# Patient Record
Sex: Female | Born: 1960 | Race: White | Hispanic: No | Marital: Married | Smoking: Never smoker
Health system: Southern US, Community
[De-identification: ages and names within clinical notes are randomized; demographics above are authoritative.]

## PROBLEM LIST (undated history)

## (undated) DIAGNOSIS — Z8041 Family history of malignant neoplasm of ovary: Secondary | ICD-10-CM

## (undated) DIAGNOSIS — E785 Hyperlipidemia, unspecified: Secondary | ICD-10-CM

## (undated) DIAGNOSIS — Z800 Family history of malignant neoplasm of digestive organs: Secondary | ICD-10-CM

## (undated) DIAGNOSIS — K635 Polyp of colon: Secondary | ICD-10-CM

## (undated) DIAGNOSIS — I10 Essential (primary) hypertension: Secondary | ICD-10-CM

## (undated) DIAGNOSIS — E079 Disorder of thyroid, unspecified: Secondary | ICD-10-CM

## (undated) DIAGNOSIS — K760 Fatty (change of) liver, not elsewhere classified: Secondary | ICD-10-CM

## (undated) DIAGNOSIS — Z87442 Personal history of urinary calculi: Secondary | ICD-10-CM

## (undated) DIAGNOSIS — F419 Anxiety disorder, unspecified: Secondary | ICD-10-CM

## (undated) DIAGNOSIS — K219 Gastro-esophageal reflux disease without esophagitis: Secondary | ICD-10-CM

## (undated) DIAGNOSIS — R87619 Unspecified abnormal cytological findings in specimens from cervix uteri: Secondary | ICD-10-CM

## (undated) HISTORY — DX: Family history of malignant neoplasm of ovary: Z80.41

## (undated) HISTORY — DX: Fatty (change of) liver, not elsewhere classified: K76.0

## (undated) HISTORY — DX: Family history of malignant neoplasm of digestive organs: Z80.0

## (undated) HISTORY — PX: PARATHYROIDECTOMY: SHX19

## (undated) HISTORY — DX: Disorder of thyroid, unspecified: E07.9

## (undated) HISTORY — DX: Essential (primary) hypertension: I10

## (undated) HISTORY — DX: Personal history of urinary calculi: Z87.442

## (undated) HISTORY — DX: Anxiety disorder, unspecified: F41.9

## (undated) HISTORY — DX: Polyp of colon: K63.5

## (undated) HISTORY — DX: Unspecified abnormal cytological findings in specimens from cervix uteri: R87.619

## (undated) HISTORY — PX: TONSILLECTOMY: SUR1361

## (undated) HISTORY — DX: Hyperlipidemia, unspecified: E78.5

## (undated) HISTORY — DX: Gastro-esophageal reflux disease without esophagitis: K21.9

## (undated) HISTORY — PX: CHOLECYSTECTOMY: SHX55

---

## 2005-10-08 HISTORY — PX: CERVICAL BIOPSY  W/ LOOP ELECTRODE EXCISION: SUR135

## 2006-08-02 ENCOUNTER — Encounter (INDEPENDENT_AMBULATORY_CARE_PROVIDER_SITE_OTHER): Payer: Self-pay | Admitting: *Deleted

## 2006-08-02 ENCOUNTER — Ambulatory Visit (HOSPITAL_COMMUNITY): Admission: RE | Admit: 2006-08-02 | Discharge: 2006-08-02 | Payer: Self-pay | Admitting: Obstetrics and Gynecology

## 2006-10-08 HISTORY — PX: COLPOSCOPY: SHX161

## 2009-04-09 ENCOUNTER — Encounter: Admission: RE | Admit: 2009-04-09 | Discharge: 2009-04-09 | Payer: Self-pay | Admitting: Cardiology

## 2009-04-23 ENCOUNTER — Encounter: Admission: RE | Admit: 2009-04-23 | Discharge: 2009-04-23 | Payer: Self-pay | Admitting: Internal Medicine

## 2009-10-28 ENCOUNTER — Encounter: Admission: RE | Admit: 2009-10-28 | Discharge: 2009-10-28 | Payer: Self-pay | Admitting: Internal Medicine

## 2011-02-23 NOTE — Op Note (Signed)
NAME:  Erin Andrade, Erin Andrade                ACCOUNT NO.:  [id number]   MEDICAL RECORD NO.:  [id number]          PATIENT TYPE:  AMB   LOCATION:  SDC                           FACILITY:  [facility]   PHYSICIAN:  Randye Lobo, M.D.   DATE OF BIRTH:  10-Oct-1960   DATE OF PROCEDURE:  08/02/2006  DATE OF DISCHARGE:                                 OPERATIVE REPORT   PREOPERATIVE DIAGNOSIS:  Persistent and recurrent abnormal Papanicolaou  smears.   POSTOPERATIVE DIAGNOSIS:  Persistent and recurrent abnormal Papanicolaou  smears.   PROCEDURES:  A loop electrosurgical excision procedure cone.   SURGEON:  Randye Lobo, M.D.   ANESTHESIA:  MAC, local with 0.5% lidocaine with epinephrine 1:200,000.   IV FLUIDS:  1000 mL Ringer's lactate.   ESTIMATED BLOOD LOSS:  Minimal.   URINE OUTPUT:  By I&O catheter prior to procedure.   COMPLICATIONS:  None.   INDICATIONS FOR PROCEDURE:  The patient is a 50 year old gravida 73 Caucasian  female with a history of abnormal Pap smears over the last 3 years.  The  patient had her first abnormal Pap smear in January 2004, which documented  ASCUS.  The patient has had two prior colposcopies by another Kylee Umana and  has not had any treatment for her abnormal Pap smears to date.  The patient  has intermittently had normal Pap smears during that period of time.  The  patient had a Pap smear on Feb 25, 2006, documenting ASCUS, cannot rule out  high-grade squamous intraepithelial lesion.  The patient came to me for a  second opinion and I performed a colposcopy on June 07, 2006, which was  satisfactory.  The patient's ECC was negative and exocervical biopsy  documented CIN I.  After a discussion regarding options for care, a decision  was made to proceed with a LEEP procedure due to the patient's longstanding  history of abnormal Pap smears.  Risks, benefits, and alternatives were  reviewed.   FINDINGS:  Exam under anesthesia revealed a small uterus.  No adnexal  masses  were appreciated.  Exam of the cervix with acetic acid documented no gross  lesions of the cervix.   SPECIMENS:  The exocervical specimen was marked with a red pin at the 12  o'clock position and the endocervical specimen was marked with a white pin  at the 12 o'clock position.  These were sent to pathology.   PROCEDURE:  The patient was reidentified in the preoperative hold area.  She  was taken to the operating room, where a MAC anesthesia was induced.  The  patient was then placed in the dorsal lithotomy position.  The vagina and  perineum were prepped with Hibiclens.   An exam under anesthesia was performed.  An insulated speculum was placed  inside the vagina and the cervix was noted to have no gross lesions.  The  cervix was stained with acetic acid and then this was followed by Lugol's  solution to outline the transformation zone of the cervix.  The cervix was  then circumferentially injected with 0.5% lidocaine with 1:200,000 of  epinephrine.  A single pass with the loop was performed with a cutting  setting of 50 watts.  This specimen was set aside.  The endocervix was then  identified and a square-shaped loop was used to remove the endocervical  specimen, again in a single pass.  Again this specimen was set aside.  Coagulation was performed of the margins of the LEEP cone bed.  Hemostasis  was noted to be good.  Lugol's solution was placed over the surgical site  and hemostasis was noted to be excellent.  The instruments were then removed  from the vagina.  The exocervix and endocervix were marked as noted above  and were sent to pathology in formalin.   This concluded the patient's procedure.  There were no complications.  All  needle, instrument, sponge counts were correct.      Randye Lobo, M.D.  Electronically Signed     BES/MEDQ  D:  08/02/2006  T:  08/03/2006  Job:  [id number]

## 2015-03-11 ENCOUNTER — Encounter: Payer: Self-pay | Admitting: Obstetrics and Gynecology

## 2015-03-11 ENCOUNTER — Ambulatory Visit (INDEPENDENT_AMBULATORY_CARE_PROVIDER_SITE_OTHER): Payer: BLUE CROSS/BLUE SHIELD | Admitting: Obstetrics and Gynecology

## 2015-03-11 VITALS — BP 132/70 | HR 80 | Resp 14 | Ht 63.25 in | Wt 192.0 lb

## 2015-03-11 DIAGNOSIS — Z01419 Encounter for gynecological examination (general) (routine) without abnormal findings: Secondary | ICD-10-CM | POA: Diagnosis not present

## 2015-03-11 DIAGNOSIS — N926 Irregular menstruation, unspecified: Secondary | ICD-10-CM | POA: Diagnosis not present

## 2015-03-11 DIAGNOSIS — Z8041 Family history of malignant neoplasm of ovary: Secondary | ICD-10-CM

## 2015-03-11 NOTE — Progress Notes (Signed)
Patient ID: Erin Andrade, female   DOB: 1961/07/09, 54 y.o.   MRN: [id number] 54 y.o.   Married Caucasian female here for annual exam.    Hot flashes are manageable.   Labs 3 weeks ago with PCP.  All normal.    Maternal grandmother and maternal aunt with ovarian cancer.  No family history of breast cancer.  Mother had colon cancer.  No family history of uterine cancer.   PCP:  Dr. Gilford Rile  Patient's last menstrual period was 02/18/2015.    Prior LMP has 6 months ago.  Has 3 cycles in 12 months.  Sexually active: No.  The current method of family planning is none.    Exercising: No.  The patient does not participate in regular exercise at present. Smoker:  no  Health Maintenance: Pap:  2015 WNL (unsure HPV testing) Dr. Philis Pique History of abnormal Pap:  Yes 2008 Positive for HPV had Colposcopy and LEEP per patient MMG:  2015 WNL Dr. Malachi Carl office -read at [facility] Colonoscopy:  2011 scheduled for next one 04-08-15 BMD:   ? TDaP:  Not sure  Screening Labs: PCP  Hb today: PCP Urine today: PCP   reports that she has never smoked. She has never used smokeless tobacco. She reports that she does not drink alcohol or use illicit drugs.  Past Medical History  Diagnosis Date  . Hypertension   . Hyperlipidemia   . GERD (gastroesophageal reflux disease)   . Anxiety   . History of kidney stones   . Abnormal Pap smear of cervix 2008?    HPV    Past Surgical History  Procedure Laterality Date  . Colposcopy  2008  . Cervical biopsy  w/ loop electrode excision  2008  . Cholecystectomy    . Tonsillectomy    . Parathyroidectomy      Current Outpatient Prescriptions  Medication Sig Dispense Refill  . atorvastatin (LIPITOR) 20 MG tablet   5  . esomeprazole (NEXIUM) 40 MG capsule   0  . losartan (COZAAR) 50 MG tablet   5  . sertraline (ZOLOFT) 50 MG tablet   5  . torsemide (DEMADEX) 20 MG tablet   5   No current facility-administered medications for this visit.     Family History  Problem Relation Age of Onset  . Colon cancer Mother   . Leukemia Father   . Hypertension Father   . Ovarian cancer Maternal Aunt   . Ovarian cancer Maternal Grandmother     ROS:  Pertinent items are noted in HPI.  Otherwise, a comprehensive ROS was negative.  Exam:   BP 132/70 mmHg  Pulse 80  Resp 14  Ht 5' 3.25" (1.607 m)  Wt 192 lb (87.091 kg)  BMI 33.72 kg/m2  LMP 02/18/2015    General appearance: alert, cooperative and appears stated age Head: Normocephalic, without obvious abnormality, atraumatic Neck: no adenopathy, supple, symmetrical, trachea midline and thyroid normal to inspection and palpation Lungs: clear to auscultation bilaterally Breasts: normal appearance, no masses or tenderness, Inspection negative, No nipple retraction or dimpling, No nipple discharge or bleeding, No axillary or supraclavicular adenopathy Heart: regular rate and rhythm Abdomen: soft, non-tender; bowel sounds normal; no masses,  no organomegaly Extremities: extremities normal, atraumatic, no cyanosis or edema Skin: Skin color, texture, turgor normal. No rashes or lesions Lymph nodes: Cervical, supraclavicular, and axillary nodes normal. No abnormal inguinal nodes palpated Neurologic: Grossly normal  Pelvic: External genitalia:  no lesions  Urethra:  normal appearing urethra with no masses, tenderness or lesions              Bartholins and Skenes: normal                 Vagina: normal appearing vagina with normal color and discharge, no lesions              Cervix: no lesions and small amount of brown bloody mucous at os.              Pap taken: Yes.   Bimanual Exam:  Uterus:  normal size, contour, position, consistency, mobility, non-tender.   Exam limited by body habitus.              Adnexa: Unable to feel adnexa.              Rectovaginal: Yes.  .  Confirms.              Anus:  normal sphincter tone, no lesions  Chaperone was present for  exam.  Assessment:   Well woman visit with normal exam. Perimenopausal female. Irregular menses. FH of ovarian and colon cancer. Hx of LEEP. Limited pelvic exam.   Plan: Yearly mammogram recommended after age 55.  Recommended self breast exam.  Pap and HR HPV as above. Labs performed.  Yes.  .   See orders.  FSH and estradiol. Refills given on medications.  No..    Referral for genetics counseling.  Return for pelvic ultrasound.  Colonoscopy due this year. Follow up annually and prn.   After visit summary provided.

## 2015-03-11 NOTE — Patient Instructions (Signed)

## 2015-03-12 LAB — FOLLICLE STIMULATING HORMONE: FSH: 8.7 m[IU]/mL

## 2015-03-12 LAB — ESTRADIOL: ESTRADIOL: 61.7 pg/mL

## 2015-03-14 ENCOUNTER — Telehealth: Payer: Self-pay | Admitting: Obstetrics and Gynecology

## 2015-03-14 NOTE — Telephone Encounter (Signed)
Call to patient. Advised of benefit quote received for PUS. °Patient agreeable. Scheduled PUS. °Advised patient of 72 hour cancellation policy and $100 cancellation fee. Patient agreeable. °

## 2015-03-14 NOTE — Addendum Note (Signed)
Addended by: Michele Mcalpine on: 03/14/2015 02:47 PM   Modules accepted: Orders

## 2015-03-15 ENCOUNTER — Telehealth: Payer: Self-pay | Admitting: Genetic Counselor

## 2015-03-15 NOTE — Telephone Encounter (Signed)
Called pt and scheduled Gen counseling appt  Erin Andrade 03/28/15 9 am

## 2015-03-16 LAB — IPS PAP TEST WITH HPV

## 2015-03-21 ENCOUNTER — Telehealth: Payer: Self-pay | Admitting: Obstetrics and Gynecology

## 2015-03-21 NOTE — Telephone Encounter (Signed)
Call to patient. She has a questions about coverage for appointment for genetics testing. Advised that usually lab work is not drawn until after appointment and benefits coverage are checked. However, number to scheduling at [facility] given to patient to call to see if they can assist with financial questions. Patient agreeable. Routing to provider for final review. Patient agreeable to disposition. Will close encounter.

## 2015-03-21 NOTE — Telephone Encounter (Signed)
Patient has a question about her referral appointment on next Monday at the [facility].

## 2015-03-28 ENCOUNTER — Other Ambulatory Visit: Payer: BLUE CROSS/BLUE SHIELD

## 2015-03-28 ENCOUNTER — Ambulatory Visit (HOSPITAL_BASED_OUTPATIENT_CLINIC_OR_DEPARTMENT_OTHER): Payer: BLUE CROSS/BLUE SHIELD | Admitting: Genetic Counselor

## 2015-03-28 ENCOUNTER — Encounter: Payer: Self-pay | Admitting: Genetic Counselor

## 2015-03-28 ENCOUNTER — Telehealth: Payer: Self-pay | Admitting: Obstetrics and Gynecology

## 2015-03-28 DIAGNOSIS — K635 Polyp of colon: Secondary | ICD-10-CM

## 2015-03-28 DIAGNOSIS — Z8051 Family history of malignant neoplasm of kidney: Secondary | ICD-10-CM

## 2015-03-28 DIAGNOSIS — Z8041 Family history of malignant neoplasm of ovary: Secondary | ICD-10-CM | POA: Diagnosis not present

## 2015-03-28 DIAGNOSIS — Z800 Family history of malignant neoplasm of digestive organs: Secondary | ICD-10-CM | POA: Diagnosis not present

## 2015-03-28 DIAGNOSIS — Z315 Encounter for genetic counseling: Secondary | ICD-10-CM

## 2015-03-28 NOTE — Telephone Encounter (Signed)
Patient cancelled her ultrasound for Thursday because she want to wait until her bloodwork for genetic testing comes back. Would like nurse to call and reschedule appointment.

## 2015-03-28 NOTE — Progress Notes (Signed)
REFERRING PROVIDER: Raina Mina, MD [place], [place] [address]   Erin Half, MD  PRIMARY PROVIDER:  Gilford Rile, MD  PRIMARY REASON FOR VISIT:  1. Family history of colon cancer   2. Family history of ovarian cancer   3. Family history of kidney cancer   4. Colon polyp      HISTORY OF PRESENT ILLNESS:   Erin Andrade, a 54 y.o. female, was seen for a [place] cancer genetics consultation at the request of Dr. Quincy Andrade due to a family history of cancer.  Erin Andrade presents to clinic today to discuss the possibility of a hereditary predisposition to cancer, genetic testing, and to further clarify her future cancer risks, as well as potential cancer risks for family members. Erin Andrade is a 54 y.o. female with no personal history of cancer.  She started colonoscopies at age 34 due to her family history of colon cancer. One polyp was found at that time.  No others have been found.  CANCER HISTORY:   No history exists.     HORMONAL RISK FACTORS:  Menarche was at age 43.  First live birth at age N/A.  OCP use for approximately 3 years.  Ovaries intact: yes.  Hysterectomy: no.  Menopausal status: premenopausal.  HRT use: 0 years. Colonoscopy: yes; normal. Mammogram within the last year: yes. Number of breast biopsies: 0. Up to date with pelvic exams:  yes. Any excessive radiation exposure in the past:  no  Past Medical History  Diagnosis Date  . Hypertension   . Hyperlipidemia   . GERD (gastroesophageal reflux disease)   . Anxiety   . History of kidney stones   . Abnormal Pap smear of cervix 2008?    HPV  . Thyroid disease   . Colon polyp     at age 11  . Family history of ovarian cancer   . Family history of colon cancer     Past Surgical History  Procedure Laterality Date  . Colposcopy  2008  . Cervical biopsy  w/ loop electrode excision  2008  . Cholecystectomy    . Tonsillectomy    . Parathyroidectomy      History   Social History  .  Marital Status: Married    Spouse Name: N/A  . Number of Children: 0  . Years of Education: N/A   Social History Main Topics  . Smoking status: Never Smoker   . Smokeless tobacco: Never Used  . Alcohol Use: No  . Drug Use: No  . Sexual Activity:    Partners: Male   Other Topics Concern  . None   Social History Narrative     FAMILY HISTORY:  We obtained a detailed, 4-generation family history.  Significant diagnoses are listed below: Family History  Problem Relation Age of Onset  . Colon cancer Mother 3  . Leukemia Father 38  . Hypertension Father   . Ovarian cancer Maternal Aunt     dx in her late 22s  . Ovarian cancer Maternal Grandmother 24  . Kidney cancer Maternal Uncle     died in early 76s  . Heart attack Paternal Grandfather    The patient does not have children.  She has one brother who is healthy.  Her mother was diagnosed with colon cancer at age 96 and died at 69.  Her mother had one brother and one sister.  The brother was diagnosed with kidney cancer in his early 38s and her sister was diagnosed  with ovarian cancer in her 26s.  Erin Andrade maternal grandmother was diagnosed with ovarian cancer at 16 and died in her 43s.  Erin Andrade father was diagnosed with leukemia at age 90.  There is no other family history of cancer. Patient's ancestors are of Caucasian descent. There is no reported Ashkenazi Jewish ancestry. There is no known consanguinity.  GENETIC COUNSELING ASSESSMENT: Erin Andrade is a 54 y.o. female with a family history of ovarian, colon and kidney which somewhat suggestive of a Lynch syndrome and predisposition to cancer. We, therefore, discussed and recommended the following at today's visit.   DISCUSSION: We reviewed the characteristics, features and inheritance patterns of hereditary cancer syndromes. We reviewed that her family history of cancer is most suggestive of Lynch syndrome.  In some cases of colon cancer, APC or MUTYH mutations can  also be found.  However, based on her family history of ovarian cancer, BRCA genes should also be considered.  We also discussed genetic testing, including the appropriate family members to test, the process of testing, insurance coverage and turn-around-time for results. We discussed the implications of a negative, positive and/or variant of uncertain significant result. We recommended Erin Andrade pursue genetic testing for the custom panel gene panel that includes the ovarian cancer genes as well as APC and MUTYH. The custom gene panel offered by [facility] includes sequencing and rearrangement analysis for the following 23 genes:  APC, ATM, BARD1, BRCA1, BRCA2, BRIP1, CDH1, CHEK2, EPCAM, FANCC, MLH1, MSH2, MSH6, MUTYH, NBN, PALB2, PMS2, PTEN, RAD51C, RAD51D, STK11, TP53, and XRCC2.     Based on Erin Andrade's family history of cancer, she meets medical criteria for genetic testing. Despite that she meets criteria, she may still have an out of pocket cost. We discussed that if her out of pocket cost for testing is over $100, the laboratory will call and confirm whether she wants to proceed with testing.  If the out of pocket cost of testing is less than $100 she will be billed by the genetic testing laboratory.   PLAN: After considering the risks, benefits, and limitations, Erin Andrade  provided informed consent to pursue genetic testing and the blood sample was sent to [facility] for analysis of the Custom panel. Results should be available within approximately 2-3 weeks' time, at which point they will be disclosed by telephone to Erin Andrade, as will any additional recommendations warranted by these results. Erin Andrade will receive a summary of her genetic counseling visit and a copy of her results once available. This information will also be available in Epic. We encouraged Erin Andrade to remain in contact with cancer genetics annually so that we can continuously update the family history and inform  her of any changes in cancer genetics and testing that may be of benefit for her family. Erin Andrade questions were answered to her satisfaction today. Our contact information was provided should additional questions or concerns arise.  Lastly, we encouraged Erin Andrade to remain in contact with cancer genetics annually so that we can continuously update the family history and inform her of any changes in cancer genetics and testing that may be of benefit for this family.   Ms.  Andrade questions were answered to her satisfaction today. Our contact information was provided should additional questions or concerns arise. Thank you for the referral and allowing [place] to share in the care of your patient.   Karen P. Florene Glen, [place], [facility] Certified Genetic Counselor [email or web address] phone: [phone]  The patient  was seen for a total of 45 minutes in face-to-face genetic counseling.  This patient was discussed with Drs. Magrinat, Lindi Adie and/or Burr Medico who agrees with the above.    _______________________________________________________________________ For Office Staff:  Number of people involved in session: 1 Was an Intern/ student involved with case: yes

## 2015-03-29 NOTE — Telephone Encounter (Signed)
Spoke with patient. Patient would like to reschedule ultrasound to 04/14/2015. States she saw genetics counselor this week and would like to have those results for Dr.Silva when she comes in. Ultrasound rescheduled for 04/14/2015 at 9:30am with 10am consult. Patient is agreeable to date and time.  Routing to provider for final review. Patient agreeable to disposition. Will close encounter.

## 2015-03-31 ENCOUNTER — Other Ambulatory Visit: Payer: BLUE CROSS/BLUE SHIELD | Admitting: Obstetrics and Gynecology

## 2015-03-31 ENCOUNTER — Other Ambulatory Visit: Payer: BLUE CROSS/BLUE SHIELD

## 2015-04-05 ENCOUNTER — Encounter: Payer: Self-pay | Admitting: Obstetrics and Gynecology

## 2015-04-13 ENCOUNTER — Other Ambulatory Visit: Payer: Self-pay

## 2015-04-13 DIAGNOSIS — Z1231 Encounter for screening mammogram for malignant neoplasm of breast: Secondary | ICD-10-CM

## 2015-04-14 ENCOUNTER — Encounter: Payer: Self-pay | Admitting: Obstetrics and Gynecology

## 2015-04-14 ENCOUNTER — Ambulatory Visit (INDEPENDENT_AMBULATORY_CARE_PROVIDER_SITE_OTHER): Payer: BLUE CROSS/BLUE SHIELD

## 2015-04-14 ENCOUNTER — Ambulatory Visit (INDEPENDENT_AMBULATORY_CARE_PROVIDER_SITE_OTHER): Payer: BLUE CROSS/BLUE SHIELD | Admitting: Obstetrics and Gynecology

## 2015-04-14 VITALS — BP 126/72 | HR 78 | Resp 14 | Ht 63.25 in | Wt 192.0 lb

## 2015-04-14 DIAGNOSIS — Z01419 Encounter for gynecological examination (general) (routine) without abnormal findings: Secondary | ICD-10-CM

## 2015-04-14 DIAGNOSIS — N915 Oligomenorrhea, unspecified: Secondary | ICD-10-CM

## 2015-04-14 DIAGNOSIS — Z8041 Family history of malignant neoplasm of ovary: Secondary | ICD-10-CM | POA: Diagnosis not present

## 2015-04-14 NOTE — Progress Notes (Signed)
Subjective  54 y.o. G0P0000 Caucasian female here for pelvic ultrasound for   LMP was 02/18/15.  FSH and estradiol were 8.7 and 61.7 respectively.   Had her colonoscopy and it was normal.  Did genetic counseling as well and did genetic testing which is still pending.   Mammogram is scheduled.  Objective  Pelvic ultrasound images and report reviewed with patient.  Uterus - no masses. EMS - 1.65 mm. Ovaries - normal.  No masses. Free fluid - no        Assessment  FH of ovarian cancer.  Normal pelvic ultrasound. Oligomenorrhea.  Perimenopausal female.  Plan  Discussion of ovarian cancer signs and symptoms.  Reassurance regarding pelvic ultrasound anatomy.  Discussion of CA125.  I feel this is not needed today.  Patient in agreement.  Discussion of perimenopausal cycles and need for monitoring of cycles.  Will call for prolonged or frequent cycles. Await results of genetic testing and recommendations to follow.   Follow up for annual exam and prn.   __15_____ minutes face to face time of which over 50% was spent in counseling.   After visit summary to patient.

## 2015-04-19 ENCOUNTER — Telehealth: Payer: Self-pay | Admitting: Genetic Counselor

## 2015-04-19 ENCOUNTER — Ambulatory Visit: Payer: Self-pay | Admitting: Genetic Counselor

## 2015-04-19 ENCOUNTER — Encounter: Payer: Self-pay | Admitting: Genetic Counselor

## 2015-04-19 DIAGNOSIS — Z1379 Encounter for other screening for genetic and chromosomal anomalies: Secondary | ICD-10-CM

## 2015-04-19 DIAGNOSIS — K635 Polyp of colon: Secondary | ICD-10-CM

## 2015-04-19 DIAGNOSIS — Z800 Family history of malignant neoplasm of digestive organs: Secondary | ICD-10-CM

## 2015-04-19 DIAGNOSIS — Z8041 Family history of malignant neoplasm of ovary: Secondary | ICD-10-CM

## 2015-04-19 NOTE — Telephone Encounter (Signed)
Patient called back.  Revealed negative genetic testing on a custom panel through [facility].  Suggested that her maternal aunt's children consider genetic testing to determine if there is a mutation in a gene that Erin Andrade did not inherit.

## 2015-04-19 NOTE — Progress Notes (Signed)
HPI: Erin Andrade was previously seen in the [facility] due to a personal history of an adenomatous polyp and a family history of colon and ovarian cancer and concerns regarding a hereditary predisposition to cancer. Additionally, Erin Andrade had been diagnosed with hyperparathyroidism which she had her parathyroid out.  Please refer to our prior cancer genetics clinic note for more information regarding Erin Andrade's medical, social and family histories, and our assessment and recommendations, at the time. Erin Andrade recent genetic test results were disclosed to her, as were recommendations warranted by these results. These results and recommendations are discussed in more detail below.  GENETIC TEST RESULTS: At the time of Erin Andrade's visit, we recommended she pursue genetic testing of a custom gene panel. This Custom gene panel offered by [facility] includes sequencing and rearrangement analysis for the following 24 genes:  APC, ATM, BARD1, BRCA1, BRCA2, BRIP1, CDH1, CHEK2, EPCAM, FANCC, MEN1, MLH1, MSH2, MSH6, MUTYH, NBN, PALB2, PMS2, PTEN, RAD51C, RAD51D, STK11, TP53, and XRCC2.  The report date is April 19, 2015.  Genetic testing was normal, and did not reveal a deleterious mutation in these genes. The test report has been scanned into EPIC and is located under the Media tab.   We discussed with Erin Andrade that since the current genetic testing is not perfect, it is possible there may be a gene mutation in one of these genes that current testing cannot detect, but that chance is small. We also discussed, that it is possible that another gene that has not yet been discovered, or that we have not yet tested, is responsible for the cancer diagnoses in the family, and it is, therefore, important to remain in touch with cancer genetics in the future so that we can continue to offer Erin Andrade the most up to date genetic testing.   CANCER SCREENING RECOMMENDATIONS: This normal  result is reassuring and indicates that Erin Andrade does not likely have an increased risk of cancer due to a mutation in one of these genes.  We, therefore, recommended  Erin Andrade continue to follow the cancer screening guidelines provided by her primary healthcare providers.   RECOMMENDATIONS FOR FAMILY MEMBERS: Women in this family might be at some increased risk of developing cancer, over the general population risk, simply due to the family history of cancer. We recommended women in this family have a yearly mammogram beginning at age 25, or 66 years younger than the earliest onset of cancer, an an annual clinical breast exam, and perform monthly breast self-exams. Women in this family should also have a gynecological exam as recommended by their primary provider. All family members should have a colonoscopy by age 75.  Based on Erin Andrade's family history, we recommended her maternal cousin, who's mother was diagnosed with ovarian cancer, have genetic counseling and testing. Erin Andrade will let us know if we can be of any assistance in coordinating genetic counseling and/or testing for this family member.   FOLLOW-UP: Lastly, we discussed with Erin Andrade that cancer genetics is a rapidly advancing field and it is possible that new genetic tests will be appropriate for her and/or her family members in the future. We encouraged her to remain in contact with cancer genetics on an annual basis so we can update her personal and family histories and let her know of advances in cancer genetics that may benefit this family.   Our contact number was provided. Erin Andrade questions were answered to her satisfaction, and she  knows she is welcome to call us at anytime with additional questions or concerns.   Roma Kayser, MS, [facility] Certified Genetic Counselor [email or web address]

## 2015-04-19 NOTE — Telephone Encounter (Signed)
LM on VM with good news.  Asked that she call back. 

## 2015-04-25 ENCOUNTER — Ambulatory Visit: Payer: BLUE CROSS/BLUE SHIELD

## 2015-04-29 ENCOUNTER — Ambulatory Visit
Admission: RE | Admit: 2015-04-29 | Discharge: 2015-04-29 | Disposition: A | Discharge status: Home / Self Care | Payer: BLUE CROSS/BLUE SHIELD | Source: Ambulatory Visit

## 2015-04-29 DIAGNOSIS — Z1231 Encounter for screening mammogram for malignant neoplasm of breast: Secondary | ICD-10-CM

## 2016-02-17 DIAGNOSIS — R5381 Other malaise: Secondary | ICD-10-CM | POA: Insufficient documentation

## 2016-02-17 DIAGNOSIS — I10 Essential (primary) hypertension: Secondary | ICD-10-CM

## 2016-02-17 DIAGNOSIS — E039 Hypothyroidism, unspecified: Secondary | ICD-10-CM

## 2016-02-17 DIAGNOSIS — E782 Mixed hyperlipidemia: Secondary | ICD-10-CM

## 2016-02-17 DIAGNOSIS — D3500 Benign neoplasm of unspecified adrenal gland: Secondary | ICD-10-CM | POA: Insufficient documentation

## 2016-02-17 DIAGNOSIS — R600 Localized edema: Secondary | ICD-10-CM

## 2016-02-17 DIAGNOSIS — R5383 Other fatigue: Secondary | ICD-10-CM

## 2016-02-17 DIAGNOSIS — F5101 Primary insomnia: Secondary | ICD-10-CM

## 2016-02-17 DIAGNOSIS — F339 Major depressive disorder, recurrent, unspecified: Secondary | ICD-10-CM

## 2016-02-17 DIAGNOSIS — E876 Hypokalemia: Secondary | ICD-10-CM | POA: Insufficient documentation

## 2016-02-17 DIAGNOSIS — E559 Vitamin D deficiency, unspecified: Secondary | ICD-10-CM | POA: Insufficient documentation

## 2016-02-17 DIAGNOSIS — Z79899 Other long term (current) drug therapy: Secondary | ICD-10-CM

## 2016-02-17 HISTORY — DX: Hypothyroidism, unspecified: E03.9

## 2016-02-17 HISTORY — DX: Other fatigue: R53.83

## 2016-02-17 HISTORY — DX: Other long term (current) drug therapy: Z79.899

## 2016-02-17 HISTORY — DX: Essential (primary) hypertension: I10

## 2016-02-17 HISTORY — DX: Other fatigue: R53.81

## 2016-02-17 HISTORY — DX: Hypokalemia: E87.6

## 2016-02-17 HISTORY — DX: Benign neoplasm of unspecified adrenal gland: D35.00

## 2016-02-17 HISTORY — DX: Vitamin D deficiency, unspecified: E55.9

## 2016-02-17 HISTORY — DX: Localized edema: R60.0

## 2016-02-17 HISTORY — DX: Mixed hyperlipidemia: E78.2

## 2016-02-17 HISTORY — DX: Primary insomnia: F51.01

## 2016-02-17 HISTORY — DX: Major depressive disorder, recurrent, unspecified: F33.9

## 2016-03-16 ENCOUNTER — Ambulatory Visit: Payer: BLUE CROSS/BLUE SHIELD | Admitting: Obstetrics and Gynecology

## 2016-04-13 ENCOUNTER — Ambulatory Visit: Payer: BLUE CROSS/BLUE SHIELD | Admitting: Obstetrics and Gynecology

## 2016-05-04 DIAGNOSIS — M2010 Hallux valgus (acquired), unspecified foot: Secondary | ICD-10-CM

## 2016-05-04 HISTORY — DX: Hallux valgus (acquired), unspecified foot: M20.10

## 2016-05-07 DIAGNOSIS — Q2381 Bicuspid aortic valve: Secondary | ICD-10-CM

## 2016-05-07 DIAGNOSIS — N200 Calculus of kidney: Secondary | ICD-10-CM | POA: Insufficient documentation

## 2016-05-07 DIAGNOSIS — Q231 Congenital insufficiency of aortic valve: Secondary | ICD-10-CM

## 2016-05-07 DIAGNOSIS — I359 Nonrheumatic aortic valve disorder, unspecified: Secondary | ICD-10-CM

## 2016-05-07 HISTORY — DX: Calculus of kidney: N20.0

## 2016-05-07 HISTORY — DX: Congenital insufficiency of aortic valve: Q23.1

## 2016-05-07 HISTORY — DX: Bicuspid aortic valve: Q23.81

## 2016-05-07 HISTORY — DX: Nonrheumatic aortic valve disorder, unspecified: I35.9

## 2016-05-15 DIAGNOSIS — M1A09X0 Idiopathic chronic gout, multiple sites, without tophus (tophi): Secondary | ICD-10-CM

## 2016-05-15 HISTORY — DX: Idiopathic chronic gout, multiple sites, without tophus (tophi): M1A.09X0

## 2016-05-24 NOTE — Progress Notes (Deleted)
55 y.o. G9P0000 Married Caucasian female here for annual exam.    PCP:     No LMP recorded.           Sexually active: {yes no:314532}  The current method of family planning is {contraception:315051}.    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 MMG:  05-02-15 B/Neg/1:TBC  Colonoscopy:  ?? 04-18-15 BMD:   ***  Result  *** TDaP:  *** Gardasil:   {YES NO:22349} HIV: Hep C: Screening Labs:  Hb today: ***, Urine today: ***   reports that she has never smoked. She has never used smokeless tobacco. She reports that she does not drink alcohol or use drugs.  Past Medical History:  Diagnosis Date  . Abnormal Pap smear of cervix 2008?   HPV  . Anxiety   . Colon polyp    at age 65  . Family history of colon cancer   . Family history of ovarian cancer   . GERD (gastroesophageal reflux disease)   . History of kidney stones   . Hyperlipidemia   . Hypertension   . Thyroid disease     Past Surgical History:  Procedure Laterality Date  . 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 CHOLECYSTECTOMY    . COLPOSCOPY  2008  . PARATHYROIDECTOMY    . TONSILLECTOMY      Current Outpatient Prescriptions  Medication Sig Dispense Refill  . atorvastatin (LIPITOR) 20 MG tablet   5  . esomeprazole (NEXIUM) 40 MG capsule   0  . losartan (COZAAR) 50 MG tablet   5  . sertraline (ZOLOFT) 50 MG tablet   5  . torsemide (DEMADEX) 20 MG tablet   5   No current facility-administered medications for this visit.     Family History  Problem Relation Age of Onset  . Colon cancer Mother 67  . Leukemia Father 87  . Hypertension Father   . Ovarian cancer Maternal Aunt     dx in her late 17s  . Ovarian cancer Maternal Grandmother 110  . Kidney cancer Maternal Uncle     died in early 88s  . Heart attack  Paternal Grandfather     ROS:  Pertinent items are noted in HPI.  Otherwise, a comprehensive ROS was negative.  Exam:   There were no vitals taken for this visit.    General appearance: alert, cooperative and appears stated age Head: Normocephalic, without obvious abnormality, atraumatic Neck: no adenopathy, supple, symmetrical, trachea midline and thyroid normal to inspection and palpation Lungs: clear to auscultation bilaterally Breasts: normal appearance, no masses or tenderness, No nipple retraction or dimpling, No nipple discharge or bleeding, No axillary or supraclavicular adenopathy Heart: regular rate and rhythm Abdomen: soft, non-tender; no masses, no organomegaly Extremities: extremities normal, atraumatic, no cyanosis or edema Skin: Skin color, texture, turgor normal. No rashes or lesions Lymph nodes: Cervical, supraclavicular, and axillary nodes normal. No abnormal inguinal nodes palpated Neurologic: Grossly normal  Pelvic: External genitalia:  no lesions              Urethra:  normal appearing urethra with no masses, tenderness or lesions              Bartholins and Skenes: normal  Vagina: normal appearing vagina with normal color and discharge, no lesions              Cervix: no lesions              Pap taken: {yes no:314532} Bimanual Exam:  Uterus:  normal size, contour, position, consistency, mobility, non-tender              Adnexa: no mass, fullness, tenderness              Rectal exam: {yes no:314532}.  Confirms.              Anus:  normal sphincter tone, no lesions  Chaperone was present for exam.  Assessment:   Well woman visit with normal exam.   Plan: Yearly mammogram recommended after age 64.  Recommended self breast exam.  Pap and HR HPV as above. Discussed Calcium, Vitamin D, regular exercise program including cardiovascular and weight bearing exercise.   Follow up annually and prn.   Additional counseling given.  {yes  [id number]. _______ minutes face to face time of which over 50% was spent in counseling.    After visit summary provided.

## 2016-05-25 ENCOUNTER — Ambulatory Visit: Payer: BLUE CROSS/BLUE SHIELD | Admitting: Obstetrics and Gynecology

## 2016-06-15 ENCOUNTER — Ambulatory Visit (INDEPENDENT_AMBULATORY_CARE_PROVIDER_SITE_OTHER): Payer: BLUE CROSS/BLUE SHIELD | Admitting: Obstetrics and Gynecology

## 2016-06-15 ENCOUNTER — Encounter: Payer: Self-pay | Admitting: Obstetrics and Gynecology

## 2016-06-15 VITALS — BP 122/74 | HR 68 | Resp 16 | Ht 63.25 in | Wt 195.6 lb

## 2016-06-15 DIAGNOSIS — Z01419 Encounter for gynecological examination (general) (routine) without abnormal findings: Secondary | ICD-10-CM | POA: Diagnosis not present

## 2016-06-15 DIAGNOSIS — Z0000 Encounter for general adult medical examination without abnormal findings: Secondary | ICD-10-CM | POA: Diagnosis not present

## 2016-06-15 DIAGNOSIS — Z23 Encounter for immunization: Secondary | ICD-10-CM | POA: Diagnosis not present

## 2016-06-15 LAB — POCT URINALYSIS DIPSTICK
BILIRUBIN UA: NEGATIVE
Blood, UA: NEGATIVE
GLUCOSE UA: NEGATIVE
Ketones, UA: NEGATIVE
LEUKOCYTES UA: NEGATIVE
NITRITE UA: NEGATIVE
Protein, UA: NEGATIVE
Urobilinogen, UA: NEGATIVE
pH, UA: 5

## 2016-06-15 MED ORDER — NYSTATIN 100000 UNIT/GM EX POWD: Freq: Three times a day (TID) | CUTANEOUS | 2 refills | Status: DC

## 2016-06-15 NOTE — Progress Notes (Signed)
55 y.o. G65P0000 Married Caucasian female here for annual exam.    Has bronchitis.  No fever.   New dx of gout.   Negative genetic testing in July 2016.   PCP:   Dr. Gilford Rile and Christena Flake, PA.    LMP was 1.5 years ago.  Some hot flashes.  These are manageable.    Sexually active: No.  The current method of family planning is post menopausal status.    Exercising: No.  The patient does not participate in regular exercise at present. Smoker:  no  Health Maintenance: Pap:  03-11-15 Neg:Neg HR HPV History of abnormal Pap:  Yes,2008 Positive for HPV had Colposcopy and LEEP per patient. MMG:  05-02-15 Density B/Neg/BiRads1:[facility] Colonoscopy:  04/08/15 Normal per patient. Next due in 2021. BMD:   n/a  Result  n/a TDaP:  Unsure Gardasil:   N/A HIV:  Donated blood 3 years ago.  Hep C:  Donated blood 3 years ago.  Screening Labs: PCP  Hb today: PCP, Urine today: Normal 5.0 pH   reports that she has never smoked. She has never used smokeless tobacco. She reports that she does not drink alcohol or use drugs.  Past Medical History:  Diagnosis Date  . Abnormal Pap smear of cervix 2008?   HPV  . Anxiety   . Colon polyp    at age 53  . Family history of colon cancer   . Family history of ovarian cancer   . GERD (gastroesophageal reflux disease)   . History of kidney stones   . Hyperlipidemia   . Hypertension   . Thyroid disease     Past Surgical History:  Procedure Laterality Date  . 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 CHOLECYSTECTOMY    . COLPOSCOPY  2008  . PARATHYROIDECTOMY    . TONSILLECTOMY      Current Outpatient Prescriptions  Medication Sig Dispense Refill  . atorvastatin (LIPITOR) 20 MG tablet   5  . esomeprazole (NEXIUM) 40 MG capsule   0  . febuxostat (ULORIC) 40 MG tablet Take 40 mg by mouth daily.    Marland Kitchen losartan (COZAAR) 50 MG tablet   5  . sertraline (ZOLOFT) 50 MG tablet   5  . torsemide (DEMADEX) 20  MG tablet   5   No current facility-administered medications for this visit.     Family History  Problem Relation Age of Onset  . Colon cancer Mother 52  . Leukemia Father 57  . Hypertension Father   . Ovarian cancer Maternal Aunt     dx in her late 73s  . Ovarian cancer Maternal Grandmother 16  . Kidney cancer Maternal Uncle     died in early 5s  . Heart attack Paternal Grandfather     ROS:  Pertinent items are noted in HPI.  Otherwise, a comprehensive ROS was negative.  Exam:   BP 122/74 (BP Location: Right Arm, Patient Position: Sitting, Cuff Size: Normal)   Pulse 68   Resp 16   Ht 5' 3.25" (1.607 m)   Wt 195 lb 9.6 oz (88.7 kg)   LMP  (LMP Unknown) Comment: Over 1 year ago.   BMI 34.38 kg/m     General appearance: alert, cooperative and appears stated age Head: Normocephalic, without obvious abnormality, atraumatic Neck: no adenopathy, supple, symmetrical, trachea midline and thyroid normal to inspection and palpation Lungs: clear to auscultation bilaterally Breasts: normal appearance,  no masses or tenderness, No nipple retraction or dimpling, No nipple discharge or bleeding, No axillary or supraclavicular adenopathy.  Raised erythematous plaques between breasts. Heart: regular rate and rhythm Abdomen: soft, non-tender; no masses, no organomegaly Extremities: extremities normal, atraumatic, no cyanosis or edema Skin: Skin color, texture, turgor normal. No rashes or lesions Lymph nodes: Cervical, supraclavicular, and axillary nodes normal. No abnormal inguinal nodes palpated Neurologic: Grossly normal  Pelvic: External genitalia:  no lesions              Urethra:  normal appearing urethra with no masses, tenderness or lesions              Bartholins and Skenes: normal                 Vagina: normal appearing vagina with normal color and discharge, no lesions              Cervix: no lesions              Pap taken: No. Bimanual Exam:  Uterus:  normal size, contour,  position, consistency, mobility, non-tender              Adnexa: no mass, fullness, tenderness              Rectal exam: Yes.  .  Confirms.              Anus:  normal sphincter tone, no lesions  Chaperone was present for exam.  Assessment:   Well woman visit with normal exam. Postmenopausal female.  FH ovarian and colon cancer.  Patient had negative genetic testing.  Hx of LEEP. Candida of the skin.   Plan: Yearly mammogram recommended after age 62.  [facility] Recommended self breast exam.  Pap and HR HPV as above. Discussed Calcium, Vitamin D, regular exercise program including cardiovascular and weight bearing exercise. Rx for Nystatin powder.  Keep skin dry.  Menopause brochure.  Importance of bone health and cardiovascular health discussed.  T Dap. Follow up annually and prn.      After visit summary provided.

## 2016-06-15 NOTE — Patient Instructions (Signed)

## 2016-07-12 ENCOUNTER — Other Ambulatory Visit: Payer: Self-pay | Admitting: Obstetrics and Gynecology

## 2016-07-12 DIAGNOSIS — Z1231 Encounter for screening mammogram for malignant neoplasm of breast: Secondary | ICD-10-CM

## 2016-07-27 ENCOUNTER — Ambulatory Visit: Payer: BLUE CROSS/BLUE SHIELD

## 2016-08-10 ENCOUNTER — Ambulatory Visit
Admission: RE | Admit: 2016-08-10 | Discharge: 2016-08-10 | Disposition: A | Discharge status: Home / Self Care | Payer: BLUE CROSS/BLUE SHIELD | Source: Ambulatory Visit | Attending: Obstetrics and Gynecology | Admitting: Obstetrics and Gynecology

## 2016-08-10 DIAGNOSIS — Z1231 Encounter for screening mammogram for malignant neoplasm of breast: Secondary | ICD-10-CM

## 2017-06-21 ENCOUNTER — Ambulatory Visit: Payer: BLUE CROSS/BLUE SHIELD | Admitting: Obstetrics and Gynecology

## 2017-10-14 ENCOUNTER — Other Ambulatory Visit: Payer: Self-pay | Admitting: Obstetrics and Gynecology

## 2017-10-14 DIAGNOSIS — Z139 Encounter for screening, unspecified: Secondary | ICD-10-CM

## 2017-11-01 ENCOUNTER — Ambulatory Visit: Payer: BLUE CROSS/BLUE SHIELD

## 2017-11-22 ENCOUNTER — Ambulatory Visit: Payer: BLUE CROSS/BLUE SHIELD

## 2017-11-29 ENCOUNTER — Telehealth: Payer: Self-pay | Admitting: Obstetrics and Gynecology

## 2017-11-29 ENCOUNTER — Ambulatory Visit: Payer: BLUE CROSS/BLUE SHIELD | Admitting: Obstetrics and Gynecology

## 2017-11-29 NOTE — Telephone Encounter (Signed)
Thank you for the update!

## 2017-11-29 NOTE — Telephone Encounter (Signed)
Pt is sick and rescheduled today's 2:00 appt to 12/13/17 at 12.

## 2017-12-13 ENCOUNTER — Other Ambulatory Visit (HOSPITAL_COMMUNITY)
Admission: RE | Admit: 2017-12-13 | Discharge: 2017-12-13 | Disposition: A | Discharge status: Home / Self Care | Payer: BLUE CROSS/BLUE SHIELD | Source: Ambulatory Visit | Attending: Obstetrics and Gynecology | Admitting: Obstetrics and Gynecology

## 2017-12-13 ENCOUNTER — Other Ambulatory Visit: Payer: Self-pay

## 2017-12-13 ENCOUNTER — Ambulatory Visit: Payer: BLUE CROSS/BLUE SHIELD | Admitting: Obstetrics and Gynecology

## 2017-12-13 ENCOUNTER — Encounter: Payer: Self-pay | Admitting: Obstetrics and Gynecology

## 2017-12-13 VITALS — BP 136/78 | HR 72 | Resp 16 | Ht 63.0 in | Wt 192.0 lb

## 2017-12-13 DIAGNOSIS — Z01419 Encounter for gynecological examination (general) (routine) without abnormal findings: Secondary | ICD-10-CM | POA: Diagnosis present

## 2017-12-13 DIAGNOSIS — Z8041 Family history of malignant neoplasm of ovary: Secondary | ICD-10-CM

## 2017-12-13 NOTE — Patient Instructions (Signed)

## 2017-12-13 NOTE — Progress Notes (Signed)
57 y.o. G64P0000 Married Caucasian female here for annual exam.    No vaginal bleeding or spotting.  No abdominal pain or bloating.  No blood  In urine or stool.  No change in bowel habits.    Labs with PCP.   PCP:   Christena Flake, PA  Patient's last menstrual period was 03/09/2015 (within months).           Sexually active: No.  The current method of family planning is post menopausal status.    Exercising: No.  The patient does not participate in regular exercise at present. Smoker:  no  Health Maintenance: Pap:  03-11-15 Neg:Neg HR HPV History of abnormal Pap:  Yes,2008 Positive for HPV had Colposcopy and LEEP per patient MMG:  08/10/16 BIRADS 1 negative/density b -- scheduled 12/27/17 Colonoscopy:  04/08/15 Normal per patient. Next due in 2021 BMD:   n/a  Result  n/a TDaP:  06/15/16 Gardasil:   n/a HIV: donated blood in the past Hep C: donated blood in the past Screening Labs:  PCP   reports that  has never smoked. she has never used smokeless tobacco. She reports that she does not drink alcohol or use drugs.  Past Medical History:  Diagnosis Date  . Abnormal Pap smear of cervix 2008?   HPV  . Anxiety   . Colon polyp    at age 66  . Family history of colon cancer   . Family history of ovarian cancer   . GERD (gastroesophageal reflux disease)   . History of kidney stones   . Hyperlipidemia   . Hypertension   . Thyroid disease     Past Surgical History:  Procedure Laterality Date  . 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 CHOLECYSTECTOMY    . COLPOSCOPY  2008  . PARATHYROIDECTOMY    . TONSILLECTOMY      Current Outpatient Medications  Medication Sig Dispense Refill  . atorvastatin (LIPITOR) 20 MG tablet   5  . esomeprazole (NEXIUM) 40 MG capsule   0  . losartan (COZAAR) 50 MG tablet   5  . nystatin (MYCOSTATIN/NYSTOP) powder Apply topically 3 (three) times daily. Apply to affected area for up to 7 days 15 g 2  . sertraline  (ZOLOFT) 50 MG tablet   5  . torsemide (DEMADEX) 20 MG tablet   5   No current facility-administered medications for this visit.     Family History  Problem Relation Age of Onset  . Colon cancer Mother 80  . Leukemia Father 63  . Hypertension Father   . Ovarian cancer Maternal Aunt        dx in her late 59s  . Ovarian cancer Maternal Grandmother 70  . Kidney cancer Maternal Uncle        died in early 70s  . Heart attack Paternal Grandfather     ROS:  Pertinent items are noted in HPI.  Otherwise, a comprehensive ROS was negative.  Exam:   BP 136/78 (BP Location: Right Arm, Patient Position: Sitting, Cuff Size: Normal)   Pulse 72   Resp 16   Ht 5\' 3"  (1.6 m)   Wt 192 lb (87.1 kg)   LMP 03/09/2015 (Within Months)   BMI 34.01 kg/m     General appearance: alert, cooperative and appears stated age Head: Normocephalic, without obvious abnormality, atraumatic Neck: no adenopathy, supple, symmetrical, trachea midline and thyroid normal to inspection and palpation Lungs: clear  to auscultation bilaterally Breasts: normal appearance, no masses or tenderness, No nipple retraction or dimpling, No nipple discharge or bleeding, No axillary or supraclavicular adenopathy Heart: regular rate and rhythm Abdomen: soft, non-tender; no masses, no organomegaly Extremities: extremities normal, atraumatic, no cyanosis or edema Skin: Skin color, texture, turgor normal. No rashes or lesions Lymph nodes: Cervical, supraclavicular, and axillary nodes normal. No abnormal inguinal nodes palpated Neurologic: Grossly normal  Pelvic: External genitalia:  no lesions              Urethra:  normal appearing urethra with no masses, tenderness or lesions              Bartholins and Skenes: normal                 Vagina: normal appearing vagina with normal color and discharge, no lesions              Cervix: no lesions              Pap taken: Yes.   Bimanual Exam:  Uterus:  normal size, contour, position,  consistency, mobility, non-tender.  Bimanual Exam limited by [facility] and inability to relax abdominal wall.              Adnexa: no mass, fullness, tenderness              Rectal exam: Yes.  .  Confirms.              Anus:  normal sphincter tone, no lesions  Chaperone was present for exam.  Assessment:   Well woman visit with normal exam. Postmenopausal female.  FH ovarian and colon cancer.  Patient had negative genetic testing.  Normal pelvic US 2016.  Hx of LEEP. Limited pelvic exam.  Plan: Mammogram screening discussed. Recommended self breast awareness. Pap and HR HPV as above. Guidelines for Calcium, Vitamin D, regular exercise program including cardiovascular and weight bearing exercise. Return for pelvic ultrasound.  Rationale discussed. Follow up annually and prn.   After visit summary provided.

## 2017-12-17 LAB — CYTOLOGY - PAP
DIAGNOSIS: NEGATIVE
HPV (WINDOPATH): NOT DETECTED

## 2017-12-26 ENCOUNTER — Encounter: Payer: Self-pay | Admitting: Obstetrics and Gynecology

## 2017-12-26 ENCOUNTER — Other Ambulatory Visit: Payer: Self-pay

## 2017-12-26 ENCOUNTER — Ambulatory Visit (INDEPENDENT_AMBULATORY_CARE_PROVIDER_SITE_OTHER): Payer: BLUE CROSS/BLUE SHIELD | Admitting: Obstetrics and Gynecology

## 2017-12-26 ENCOUNTER — Ambulatory Visit (INDEPENDENT_AMBULATORY_CARE_PROVIDER_SITE_OTHER): Payer: BLUE CROSS/BLUE SHIELD

## 2017-12-26 VITALS — BP 110/78 | HR 68 | Resp 16 | Ht 63.0 in | Wt 192.0 lb

## 2017-12-26 DIAGNOSIS — Z6834 Body mass index (BMI) 34.0-34.9, adult: Secondary | ICD-10-CM

## 2017-12-26 DIAGNOSIS — Z8041 Family history of malignant neoplasm of ovary: Secondary | ICD-10-CM | POA: Diagnosis not present

## 2017-12-26 NOTE — Progress Notes (Signed)
GYNECOLOGY  VISIT   HPI: 57 y.o.   Married  Caucasian  female   G0P0000 with Patient's last menstrual period was 03/09/2015 (within months).   here for  ultrasound for limited bimanual exam and elevated BMI.   FH ovarian and colon cancer.  Patient has had negative genetic testing.   GYNECOLOGIC HISTORY: Patient's last menstrual period was 03/09/2015 (within months). Contraception:  Postmenopausal  Menopausal hormone therapy:  none Last mammogram:  08/10/16 BIRADS 1 negative/density b -- scheduled 12/27/17 Last pap smear:   12/13/17 Pap and HR HPV negative        OB History    Gravida  0   Para  0   Term  0   Preterm  0   AB  0   Living  0     SAB  0   TAB  0   Ectopic  0   Multiple  0   Live Births                 Patient Active Problem List   Diagnosis Date Noted  . Genetic testing 04/19/2015  . Colon polyp   . Family history of ovarian cancer   . Family history of colon cancer     Past Medical History:  Diagnosis Date  . Abnormal Pap smear of cervix 2008?   HPV  . Anxiety   . Colon polyp    at age 80  . Family history of colon cancer   . Family history of ovarian cancer   . GERD (gastroesophageal reflux disease)   . History of kidney stones   . Hyperlipidemia   . Hypertension   . Thyroid disease     Past Surgical History:  Procedure Laterality Date  . 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 CHOLECYSTECTOMY    . COLPOSCOPY  2008  . PARATHYROIDECTOMY    . TONSILLECTOMY      Current Outpatient Medications  Medication Sig Dispense Refill  . atorvastatin (LIPITOR) 20 MG tablet   5  . esomeprazole (NEXIUM) 40 MG capsule   0  . losartan (COZAAR) 50 MG tablet   5  . nystatin (MYCOSTATIN/NYSTOP) powder Apply topically 3 (three) times daily. Apply to affected area for up to 7 days 15 g 2  . sertraline (ZOLOFT) 50 MG tablet   5  . torsemide (DEMADEX) 20 MG tablet   5   No current facility-administered  medications for this visit.      ALLERGIES: Penicillins  Family History  Problem Relation Age of Onset  . Colon cancer Mother 75  . Leukemia Father 73  . Hypertension Father   . Ovarian cancer Maternal Aunt        dx in her late 57s  . Ovarian cancer Maternal Grandmother 16  . Kidney cancer Maternal Uncle        died in early 46s  . Heart attack Paternal Grandfather     Social History   Socioeconomic History  . Marital status: Married    Spouse name: Not on file  . Number of children: 0  . Years of education: Not on file  . Highest education level: Not on file  Occupational History  . Not on file  Social Needs  . Financial resource strain: Not on file  . Food insecurity:    Worry: Not on file    Inability: Not on file  . Transportation needs:  Medical: Not on file    Non-medical: Not on file  Tobacco Use  . Smoking status: Never Smoker  . Smokeless tobacco: Never Used  Substance and Sexual Activity  . Alcohol use: No    Alcohol/week: 0.0 oz  . Drug use: No  . Sexual activity: Not Currently    Partners: Male  Lifestyle  . Physical activity:    Days per week: Not on file    Minutes per session: Not on file  . Stress: Not on file  Relationships  . Social connections:    Talks on phone: Not on file    Gets together: Not on file    Attends religious service: Not on file    Active member of club or organization: Not on file    Attends meetings of clubs or organizations: Not on file    Relationship status: Not on file  . Intimate partner violence:    Fear of current or ex partner: Not on file    Emotionally abused: Not on file    Physically abused: Not on file    Forced sexual activity: Not on file  Other Topics Concern  . Not on file  Social History Narrative  . Not on file    ROS:  Pertinent items are noted in HPI.  PHYSICAL EXAMINATION:    BP 110/78 (BP Location: Left Arm, Patient Position: Sitting, Cuff Size: Large)   Pulse 68   Resp 16   Ht  5\' 3"  (1.6 m)   Wt 192 lb (87.1 kg)   LMP 03/09/2015 (Within Months)   BMI 34.01 kg/m     General appearance: alert, cooperative and appears stated age   Pelvic US Normal uterus and EMS.  Normal ovaries.  No free fluid.   ASSESSMENT  FH ovarian and colon cancer.  Patient has had negative genetic testing.  Elevated BMI with limited pelvic exam.  PLAN  Discussed normal ultrasound findings.  We reviewed the parameters that we look for for signs of potential ovarian pathology and malignancy.  Fu prn.    An After Visit Summary was printed and given to the patient.  _15_____ minutes face to face time of which over 50% was spent in counseling.

## 2017-12-27 ENCOUNTER — Ambulatory Visit
Admission: RE | Admit: 2017-12-27 | Discharge: 2017-12-27 | Disposition: A | Discharge status: Home / Self Care | Payer: BLUE CROSS/BLUE SHIELD | Source: Ambulatory Visit | Attending: Obstetrics and Gynecology | Admitting: Obstetrics and Gynecology

## 2017-12-27 DIAGNOSIS — Z139 Encounter for screening, unspecified: Secondary | ICD-10-CM

## 2018-07-23 ENCOUNTER — Encounter: Payer: Self-pay | Admitting: Obstetrics and Gynecology

## 2018-07-23 ENCOUNTER — Other Ambulatory Visit: Payer: Self-pay

## 2018-07-23 ENCOUNTER — Ambulatory Visit: Payer: BLUE CROSS/BLUE SHIELD | Admitting: Obstetrics and Gynecology

## 2018-07-23 VITALS — BP 130/68 | HR 60 | Ht 63.0 in | Wt 198.6 lb

## 2018-07-23 DIAGNOSIS — N760 Acute vaginitis: Secondary | ICD-10-CM

## 2018-07-23 MED ORDER — FLUCONAZOLE 150 MG PO TABS: 150.0000 mg | ORAL_TABLET | Freq: Once | ORAL | 0 refills | Status: AC

## 2018-07-23 MED ORDER — NYSTATIN-TRIAMCINOLONE 100000-0.1 UNIT/GM-% EX OINT: 1.0000 "application " | TOPICAL_OINTMENT | Freq: Two times a day (BID) | CUTANEOUS | 0 refills | Status: DC

## 2018-07-23 NOTE — Progress Notes (Signed)
GYNECOLOGY  VISIT   HPI: 57 y.o.   Married  Caucasian  female   G0P0000 with Patient's last menstrual period was 03/09/2015 (within months).   here for vulvar itching and irritation and vaginal discharge off and for 3 weeks.  Having some external swelling.   Using essential oils in her bathwater.   No recent abx or steroids.   No OTC treatment.   Using Nystatin powder only prn.   hgb A1c 5.8.   GYNECOLOGIC HISTORY: Patient's last menstrual period was 03/09/2015 (within months). Contraception:  Postmenopausal Menopausal hormone therapy:  none Last mammogram: 12-27-17 Neg/Density B/BiRads1 Last pap smear: 12-13-17 Neg:Neg HR HPV, 03-24-15 Neg:Neg HR HPV        OB History    Gravida  0   Para  0   Term  0   Preterm  0   AB  0   Living  0     SAB  0   TAB  0   Ectopic  0   Multiple  0   Live Births                 Patient Active Problem List   Diagnosis Date Noted  . Genetic testing 04/19/2015  . Colon polyp   . Family history of ovarian cancer   . Family history of colon cancer     Past Medical History:  Diagnosis Date  . Abnormal Pap smear of cervix 2008?   HPV  . Anxiety   . Colon polyp    at age 41  . Family history of colon cancer   . Family history of ovarian cancer   . GERD (gastroesophageal reflux disease)   . History of kidney stones   . Hyperlipidemia   . Hypertension   . Thyroid disease     Past Surgical History:  Procedure Laterality Date  . 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 CHOLECYSTECTOMY    . COLPOSCOPY  2008  . PARATHYROIDECTOMY    . TONSILLECTOMY      Current Outpatient Medications  Medication Sig Dispense Refill  . atorvastatin (LIPITOR) 20 MG tablet   5  . esomeprazole (NEXIUM) 40 MG capsule   0  . losartan (COZAAR) 50 MG tablet   5  . nystatin (MYCOSTATIN/NYSTOP) powder Apply topically 3 (three) times daily. Apply to affected area for up to 7 days 15 g 2  . sertraline  (ZOLOFT) 50 MG tablet   5  . torsemide (DEMADEX) 20 MG tablet   5   No current facility-administered medications for this visit.      ALLERGIES: Penicillins  Family History  Problem Relation Age of Onset  . Colon cancer Mother 61  . Leukemia Father 59  . Hypertension Father   . Ovarian cancer Maternal Aunt        dx in her late 64s  . Ovarian cancer Maternal Grandmother 14  . Kidney cancer Maternal Uncle        died in early 70s  . Heart attack Paternal Grandfather     Social History   Socioeconomic History  . Marital status: Married    Spouse name: Not on file  . Number of children: 0  . Years of education: Not on file  . Highest education level: Not on file  Occupational History  . Not on file  Social Needs  . Financial resource strain: Not on file  . Food insecurity:  Worry: Not on file    Inability: Not on file  . Transportation needs:    Medical: Not on file    Non-medical: Not on file  Tobacco Use  . Smoking status: Never Smoker  . Smokeless tobacco: Never Used  Substance and Sexual Activity  . Alcohol use: No    Alcohol/week: 0.0 standard drinks  . Drug use: No  . Sexual activity: Not Currently    Partners: Male  Lifestyle  . Physical activity:    Days per week: Not on file    Minutes per session: Not on file  . Stress: Not on file  Relationships  . Social connections:    Talks on phone: Not on file    Gets together: Not on file    Attends religious service: Not on file    Active member of club or organization: Not on file    Attends meetings of clubs or organizations: Not on file    Relationship status: Not on file  . Intimate partner violence:    Fear of current or ex partner: Not on file    Emotionally abused: Not on file    Physically abused: Not on file    Forced sexual activity: Not on file  Other Topics Concern  . Not on file  Social History Narrative  . Not on file    Review of Systems  All other systems reviewed and are  negative.   PHYSICAL EXAMINATION:    BP 130/68 (BP Location: Right Arm, Patient Position: Sitting, Cuff Size: Normal)   Pulse 60   Ht 5\' 3"  (1.6 m)   Wt 198 lb 9.6 oz (90.1 kg)   LMP 03/09/2015 (Within Months)   BMI 35.18 kg/m     General appearance: alert, cooperative and appears stated age   Pelvic: External genitalia:   Pink color change to skin.               Urethra:  normal appearing urethra with no masses, tenderness or lesions              Bartholins and Skenes: normal                 Vagina: normal appearing vagina with normal color and green curd like discharge, no lesions              Cervix: no lesions                Bimanual Exam:  Uterus:  normal size, contour, position, consistency, mobility, non-tender              Adnexa: no mass, fullness, tenderness     Chaperone was present for exam.  ASSESSMENT  Vulvovaginitis.  Looks like yeast.    PLAN  Affirm done.  Avoid essential oils.  Use neutral soap like Dove.  Mycolog II and Diflucan.  FU prn.   An After Visit Summary was printed and given to the patient.  __15____ minutes face to face time of which over 50% was spent in counseling.

## 2018-07-23 NOTE — Patient Instructions (Signed)

## 2018-07-24 LAB — VAGINITIS/VAGINOSIS, DNA PROBE
Candida Species: POSITIVE — AB
GARDNERELLA VAGINALIS: POSITIVE — AB
Trichomonas vaginosis: NEGATIVE

## 2018-07-25 ENCOUNTER — Telehealth: Payer: Self-pay | Admitting: Emergency Medicine

## 2018-07-25 MED ORDER — METRONIDAZOLE 500 MG PO TABS: 500.0000 mg | ORAL_TABLET | Freq: Two times a day (BID) | ORAL | 0 refills | Status: DC

## 2018-07-25 NOTE — Telephone Encounter (Signed)
-----   Message from Nunzio Cobbs, MD sent at 07/25/2018  2:43 PM EDT ----- Please inform of Affirm result showing yeast and bacterial vaginosis. I already gave her a prescription for Diflucan.  She may treat the BV with Flagyl 500 mg po bid for 7 days or Metrogel pv at hs for 5 nights.  Please send Rx to pharmacy of choice. ETOH precautions.

## 2018-07-25 NOTE — Telephone Encounter (Signed)
Spoke with patient and message from Dr. Quincy Simmonds given results vaginitis testing.  Verbalized understanding of bacterial vaginosis results. Agreeable to oral treatment with Flagyl and instructions given with ETOH precautions, pt states she does not drink alcohol at all. She will call back with any concerns.

## 2019-01-02 ENCOUNTER — Ambulatory Visit: Payer: BLUE CROSS/BLUE SHIELD | Admitting: Obstetrics and Gynecology

## 2019-03-05 ENCOUNTER — Other Ambulatory Visit: Payer: Self-pay | Admitting: Obstetrics and Gynecology

## 2019-03-05 DIAGNOSIS — Z1231 Encounter for screening mammogram for malignant neoplasm of breast: Secondary | ICD-10-CM

## 2019-03-11 ENCOUNTER — Telehealth: Payer: Self-pay | Admitting: Obstetrics and Gynecology

## 2019-03-11 NOTE — Telephone Encounter (Signed)
Left message on voicemail to call and reschedule cancelled appointment. °

## 2019-04-17 ENCOUNTER — Ambulatory Visit: Payer: BLUE CROSS/BLUE SHIELD | Admitting: Obstetrics and Gynecology

## 2019-04-22 ENCOUNTER — Other Ambulatory Visit: Payer: Self-pay

## 2019-04-22 ENCOUNTER — Ambulatory Visit: Payer: BC Managed Care – PPO | Admitting: Obstetrics and Gynecology

## 2019-04-22 ENCOUNTER — Encounter: Payer: Self-pay | Admitting: Obstetrics and Gynecology

## 2019-04-22 VITALS — BP 126/70 | HR 80 | Temp 97.5°F | Resp 12 | Ht 64.25 in | Wt 196.4 lb

## 2019-04-22 DIAGNOSIS — Z8041 Family history of malignant neoplasm of ovary: Secondary | ICD-10-CM

## 2019-04-22 DIAGNOSIS — Z01419 Encounter for gynecological examination (general) (routine) without abnormal findings: Secondary | ICD-10-CM

## 2019-04-22 MED ORDER — NYSTATIN 100000 UNIT/GM EX POWD: Freq: Three times a day (TID) | CUTANEOUS | 2 refills | Status: DC

## 2019-04-22 NOTE — Progress Notes (Signed)
58 y.o. G56P0000 Married Caucasian female here for annual exam.    Using Nystatin for flare ups of itching.  Still working during the pandemic.   PCP: Gilford Rile, MD    Patient's last menstrual period was 03/09/2015 (within months).           Sexually active: No.  The current method of family planning is post menopausal status.    Exercising: No.  The patient does not participate in regular exercise at present. 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 Next due in 2021 BMD:   n/a  Result  n/a TDaP:  2017 HIV and Hep C: donated blood in the past Screening Labs: PCP   reports that she has never smoked. She has never used smokeless tobacco. She reports that she does not drink alcohol or use drugs.  Past Medical History:  Diagnosis Date  . Abnormal Pap smear of cervix 2008?   HPV  . Anxiety   . Colon polyp    at age 64  . Family history of colon cancer   . Family history of ovarian cancer   . GERD (gastroesophageal reflux disease)   . History of kidney stones   . Hyperlipidemia   . Hypertension   . Thyroid disease     Past Surgical History:  Procedure Laterality Date  . 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 CHOLECYSTECTOMY    . COLPOSCOPY  2008  . PARATHYROIDECTOMY    . TONSILLECTOMY      Current Outpatient Medications  Medication Sig Dispense Refill  . atorvastatin (LIPITOR) 20 MG tablet   5  . esomeprazole (NEXIUM) 40 MG capsule   0  . losartan (COZAAR) 50 MG tablet   5  . nystatin (MYCOSTATIN/NYSTOP) powder Apply topically 3 (three) times daily. Apply to affected area for up to 7 days 15 g 2  . nystatin-triamcinolone ointment (MYCOLOG) Apply 1 application topically 2 (two) times daily. 30 g 0  . potassium chloride  (K-DUR) 10 MEQ tablet TAKE 1 TABLET BY MOUTH DAILY    . sertraline (ZOLOFT) 50 MG tablet   5  . torsemide (DEMADEX) 20 MG tablet   5   No current facility-administered medications for this visit.     Family History  Problem Relation Age of Onset  . Colon cancer Mother 38  . Leukemia Father 46  . Hypertension Father   . Ovarian cancer Maternal Aunt        dx in her late 34s  . Ovarian cancer Maternal Grandmother 33  . Kidney cancer Maternal Uncle        died in early 60s  . Heart attack Paternal Grandfather     Review of Systems  Constitutional: Negative.   HENT: Negative.   Eyes: Negative.   Respiratory: Negative.   Cardiovascular: Negative.   Gastrointestinal: Negative.   Endocrine: Negative.   Genitourinary: Negative.   Musculoskeletal: Negative.   Skin: Negative.   Allergic/Immunologic: Negative.   Neurological: Negative.   Hematological: Negative.   Psychiatric/Behavioral: Negative.     Exam:   BP 126/70 (BP Location: Right Arm, Patient Position: Sitting, Cuff Size: Normal)   Pulse 80   Temp (!) 97.5 F (36.4 C) (Temporal)  Resp 12   Ht 5' 4.25" (1.632 m)   Wt 196 lb 6.4 oz (89.1 kg)   LMP 03/09/2015 (Within Months)   BMI 33.45 kg/m     General appearance: alert, cooperative and appears stated age Head: normocephalic, without obvious abnormality, atraumatic Neck: no adenopathy, supple, symmetrical, trachea midline and thyroid normal to inspection and palpation Lungs: clear to auscultation bilaterally Breasts: normal appearance, no masses or tenderness, No nipple retraction or dimpling, No nipple discharge or bleeding, No axillary adenopathy Heart: regular rate and rhythm Abdomen: soft, non-tender; no masses, no organomegaly Extremities: extremities normal, atraumatic, no cyanosis or edema Skin: skin color, texture, turgor normal. No rashes or lesions Lymph nodes: cervical, supraclavicular, and axillary nodes normal. Neurologic: grossly normal  Pelvic:  External genitalia:  no lesions              No abnormal inguinal nodes palpated.              Urethra:  normal appearing urethra with no masses, tenderness or lesions              Bartholins and Skenes: normal                 Vagina: normal appearing vagina with normal color and discharge, no lesions              Cervix: no lesions              Pap taken: No. Bimanual Exam:  Uterus:  normal size, contour, position, consistency, mobility, non-tender.  Bm exam limited by [facility].              Adnexa: no mass, fullness, tenderness              Rectal exam: Yes.  .  Confirms.              Anus:  normal sphincter tone, no lesions  Chaperone was present for exam.  Assessment:   Well woman visit with normal exam. Postmenopausal female.  FH ovarian and colon cancer. Patient had negative genetic testing.  Normal pelvic US 2016.  Hx of LEEP.  Plan: Mammogram screening discussed. Self breast awareness reviewed. Pap and HR HPV as above. Guidelines for Calcium, Vitamin D, regular exercise program including cardiovascular and weight bearing exercise. Return for pelvic US.   Follow up annually and prn.    After visit summary provided.

## 2019-04-22 NOTE — Patient Instructions (Signed)

## 2019-04-23 ENCOUNTER — Telehealth: Payer: Self-pay | Admitting: Obstetrics and Gynecology

## 2019-04-23 NOTE — Telephone Encounter (Signed)
Call placed to patient to review benefit and scheduled recommended ultrasound. Left voicemail message requesting a return call

## 2019-04-24 ENCOUNTER — Ambulatory Visit: Payer: BLUE CROSS/BLUE SHIELD

## 2019-04-27 NOTE — Telephone Encounter (Signed)
Second call placed to scheduled recommended ultrasound. Left voicemail message requesting a return call

## 2019-04-27 NOTE — Telephone Encounter (Signed)
Returned call to patient. Reviewed benefit for recommended ultrasound. Patient understood information presented and is agreeable. Patient is scheduled 04/27/2019 with Dr Quincy Simmonds. Patient is aware of the appointment date, arrival time and cancellation policy. No further questions. Will close encounter

## 2019-05-05 ENCOUNTER — Telehealth: Payer: Self-pay | Admitting: Obstetrics and Gynecology

## 2019-05-05 NOTE — Telephone Encounter (Signed)
Left message to call Sharee Pimple, RN at [facility].    PUS for family Hx of ovarian cancer

## 2019-05-05 NOTE — Telephone Encounter (Signed)
Patient cancelled and would like to reschedule her ultrasound.

## 2019-05-07 ENCOUNTER — Other Ambulatory Visit: Payer: BC Managed Care – PPO

## 2019-05-07 ENCOUNTER — Other Ambulatory Visit: Payer: BC Managed Care – PPO | Admitting: Obstetrics and Gynecology

## 2019-05-20 NOTE — Telephone Encounter (Signed)
Spoke with patient. Patient request to schedule PUS on 06/18/19. PUS scheduled for 9/10 at 8:30am, consult at 9am with Dr. Quincy Simmonds.   Routing to provider for final review. Patient is agreeable to disposition. Will close encounter.  Cc: Lerry Liner, Magdalene Patricia

## 2019-06-12 ENCOUNTER — Ambulatory Visit
Admission: RE | Admit: 2019-06-12 | Discharge: 2019-06-12 | Disposition: A | Discharge status: Home / Self Care | Payer: BC Managed Care – PPO | Source: Ambulatory Visit | Attending: Obstetrics and Gynecology | Admitting: Obstetrics and Gynecology

## 2019-06-12 ENCOUNTER — Other Ambulatory Visit: Payer: Self-pay

## 2019-06-12 DIAGNOSIS — Z1231 Encounter for screening mammogram for malignant neoplasm of breast: Secondary | ICD-10-CM

## 2019-06-18 ENCOUNTER — Other Ambulatory Visit: Payer: Self-pay

## 2019-06-18 ENCOUNTER — Encounter: Payer: Self-pay | Admitting: Obstetrics and Gynecology

## 2019-06-18 ENCOUNTER — Ambulatory Visit (INDEPENDENT_AMBULATORY_CARE_PROVIDER_SITE_OTHER): Payer: BC Managed Care – PPO

## 2019-06-18 ENCOUNTER — Ambulatory Visit: Payer: BC Managed Care – PPO | Admitting: Obstetrics and Gynecology

## 2019-06-18 VITALS — BP 140/78 | HR 70 | Temp 97.3°F | Ht 63.0 in | Wt 197.6 lb

## 2019-06-18 DIAGNOSIS — Z8041 Family history of malignant neoplasm of ovary: Secondary | ICD-10-CM

## 2019-06-18 NOTE — Progress Notes (Signed)
GYNECOLOGY  VISIT   HPI: 58 y.o.   Married  Caucasian  female   G0P0000 with Patient's last menstrual period was 03/09/2015 (within months).   here for pelvic ultrasound.   She is feeling good.  Denies pain, bleeding, or bloating symptoms.   GYNECOLOGIC HISTORY: Patient's last menstrual period was 03/09/2015 (within months). Contraception: postmenopausal Menopausal hormone therapy:  none Last mammogram: 06-12-19 3D Neg/density B/BiRads1 Last pap smear:  12-13-17 Neg:Neg HR HPV        OB History    Gravida  0   Para  0   Term  0   Preterm  0   AB  0   Living  0     SAB  0   TAB  0   Ectopic  0   Multiple  0   Live Births                 Patient Active Problem List   Diagnosis Date Noted  . Genetic testing 04/19/2015  . Colon polyp   . Family history of ovarian cancer   . Family history of colon cancer     Past Medical History:  Diagnosis Date  . Abnormal Pap smear of cervix 2008?   HPV  . Anxiety   . Colon polyp    at age 67  . Family history of colon cancer   . Family history of ovarian cancer   . GERD (gastroesophageal reflux disease)   . History of kidney stones   . Hyperlipidemia   . Hypertension   . Thyroid disease     Past Surgical History:  Procedure Laterality Date  . 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 CHOLECYSTECTOMY    . COLPOSCOPY  2008  . PARATHYROIDECTOMY    . TONSILLECTOMY      Current Outpatient Medications  Medication Sig Dispense Refill  . atorvastatin (LIPITOR) 20 MG tablet   5  . esomeprazole (NEXIUM) 40 MG capsule   0  . losartan (COZAAR) 50 MG tablet   5  . nystatin (MYCOSTATIN/NYSTOP) powder Apply topically 3 (three) times daily. Apply to affected area for up to 7 days 15 g 2  . nystatin-triamcinolone ointment (MYCOLOG) Apply 1 application topically 2 (two) times daily. 30 g 0  . potassium chloride (K-DUR) 10 MEQ tablet TAKE 1 TABLET BY MOUTH DAILY    . sertraline (ZOLOFT)  50 MG tablet   5  . torsemide (DEMADEX) 20 MG tablet   5   No current facility-administered medications for this visit.      ALLERGIES: Penicillins  Family History  Problem Relation Age of Onset  . Colon cancer Mother 74  . Leukemia Father 40  . Hypertension Father   . Ovarian cancer Maternal Aunt        dx in her late 81s  . Ovarian cancer Maternal Grandmother 27  . Kidney cancer Maternal Uncle        died in early 68s  . Heart attack Paternal Grandfather   . Breast cancer Neg Hx     Social History   Socioeconomic History  . Marital status: Married    Spouse name: Not on file  . Number of children: 0  . Years of education: Not on file  . Highest education level: Not on file  Occupational History  . Not on file  Social Needs  . Financial resource strain: Not on file  .  Food insecurity    Worry: Not on file    Inability: Not on file  . Transportation needs    Medical: Not on file    Non-medical: Not on file  Tobacco Use  . Smoking status: Never Smoker  . Smokeless tobacco: Never Used  Substance and Sexual Activity  . Alcohol use: No    Alcohol/week: 0.0 standard drinks  . Drug use: No  . Sexual activity: Not Currently    Partners: Male  Lifestyle  . Physical activity    Days per week: Not on file    Minutes per session: Not on file  . Stress: Not on file  Relationships  . Social Herbalist on phone: Not on file    Gets together: Not on file    Attends religious service: Not on file    Active member of club or organization: Not on file    Attends meetings of clubs or organizations: Not on file    Relationship status: Not on file  . Intimate partner violence    Fear of current or ex partner: Not on file    Emotionally abused: Not on file    Physically abused: Not on file    Forced sexual activity: Not on file  Other Topics Concern  . Not on file  Social History Narrative  . Not on file    Review of Systems  All other systems reviewed  and are negative.   PHYSICAL EXAMINATION:    BP 140/78 (Cuff Size: Large)   Pulse 70   Temp (!) 97.3 F (36.3 C) (Temporal)   Ht 5\' 3"  (1.6 m)   Wt 197 lb 9.6 oz (89.6 kg)   LMP 03/09/2015 (Within Months)   BMI 35.00 kg/m     General appearance: alert, cooperative and appears stated age   Pelvic US Normal uterus.  EMS 1.58 mm.  Ovaries normal. No free fluid.   ASSESSMENT  FH ovarian and colon cancer. Patient had negative genetic testing. Normal pelvic US.  PLAN  We discussed ovarian cancer and the lack of screening guidelines for asymptomatic patients.  We may still elect to do periodic pelvic US.  She will continue with yearly annual exams.   An After Visit Summary was printed and given to the patient.  _15_____ minutes face to face time of which over 50% was spent in counseling.

## 2019-10-13 DIAGNOSIS — R002 Palpitations: Secondary | ICD-10-CM

## 2019-10-13 HISTORY — DX: Palpitations: R00.2

## 2019-10-16 ENCOUNTER — Encounter: Payer: Self-pay | Admitting: *Deleted

## 2019-10-22 ENCOUNTER — Encounter: Payer: Self-pay | Admitting: *Deleted

## 2019-10-23 ENCOUNTER — Other Ambulatory Visit: Payer: Self-pay

## 2019-10-23 ENCOUNTER — Ambulatory Visit (INDEPENDENT_AMBULATORY_CARE_PROVIDER_SITE_OTHER): Payer: BC Managed Care – PPO | Admitting: Cardiology

## 2019-10-23 ENCOUNTER — Encounter: Payer: Self-pay | Admitting: Cardiology

## 2019-10-23 VITALS — BP 140/82 | HR 69 | Ht 63.0 in | Wt 200.0 lb

## 2019-10-23 DIAGNOSIS — E782 Mixed hyperlipidemia: Secondary | ICD-10-CM | POA: Diagnosis not present

## 2019-10-23 DIAGNOSIS — Q231 Congenital insufficiency of aortic valve: Secondary | ICD-10-CM

## 2019-10-23 DIAGNOSIS — E669 Obesity, unspecified: Secondary | ICD-10-CM | POA: Insufficient documentation

## 2019-10-23 DIAGNOSIS — I10 Essential (primary) hypertension: Secondary | ICD-10-CM

## 2019-10-23 DIAGNOSIS — R002 Palpitations: Secondary | ICD-10-CM

## 2019-10-23 MED ORDER — CARVEDILOL 3.125 MG PO TABS: 3.1250 mg | ORAL_TABLET | Freq: Two times a day (BID) | ORAL | 1 refills | Status: DC

## 2019-10-23 NOTE — Patient Instructions (Signed)
Medication Instructions:  Your physician has recommended you make the following change in your medication:   START: COreg(carvedilol) 3.125 mg Take 1 tab twice daily  *If you need a refill on your cardiac medications before your next appointment, please call your pharmacy*  Lab Work: NOne If you have labs (blood work) drawn today and your tests are completely normal, you will receive your results only by: Marland Kitchen MyChart Message (if you have MyChart) OR . A paper copy in the mail If you have any lab test that is abnormal or we need to change your treatment, we will call you to review the results.  Testing/Procedures: Your physician has requested that you have an echocardiogram. Echocardiography is a painless test that uses sound waves to create images of your heart. It provides your doctor with information about the size and shape of your heart and how well your heart's chambers and valves are working. This procedure takes approximately one hour. There are no restrictions for this procedure.    Follow-Up: At [facility], you and your health needs are our priority.  As part of our continuing mission to provide you with exceptional heart care, we have created designated Provider Care Teams.  These Care Teams include your primary Cardiologist (physician) and Advanced Practice Providers (APPs -  Physician Assistants and Nurse Practitioners) who all work together to provide you with the care you need, when you need it.  Your next appointment:   1 month(s)  The format for your next appointment:   In Person  Provider:   Berniece Salines, DO  Other Instructions  Echocardiogram An echocardiogram is a procedure that uses painless sound waves (ultrasound) to produce an image of the heart. Images from an echocardiogram can provide important information about:  Signs of coronary artery disease (CAD).  Aneurysm detection. An aneurysm is a weak or damaged part of an artery wall that bulges out from  the normal force of blood pumping through the body.  Heart size and shape. Changes in the size or shape of the heart can be associated with certain conditions, including heart failure, aneurysm, and CAD.  Heart muscle function.  Heart valve function.  Signs of a past heart attack.  Fluid buildup around the heart.  Thickening of the heart muscle.  A tumor or infectious growth around the heart valves. Tell a health care provider about:  Any allergies you have.  All medicines you are taking, including vitamins, herbs, eye drops, creams, and over-the-counter medicines.  Any blood disorders you have.  Any surgeries you have had.  Any medical conditions you have.  Whether you are pregnant or may be pregnant. What are the risks? Generally, this is a safe procedure. However, problems may occur, including:  Allergic reaction to dye (contrast) that may be used during the procedure. What happens before the procedure? No specific preparation is needed. You may eat and drink normally. What happens during the procedure?   An IV tube may be inserted into one of your veins.  You may receive contrast through this tube. A contrast is an injection that improves the quality of the pictures from your heart.  A gel will be applied to your chest.  A wand-like tool (transducer) will be moved over your chest. The gel will help to transmit the sound waves from the transducer.  The sound waves will harmlessly bounce off of your heart to allow the heart images to be captured in real-time motion. The images will be recorded on a  computer. The procedure may vary among health care providers and hospitals. What happens after the procedure?  You may return to your normal, everyday life, including diet, activities, and medicines, unless your health care provider tells you not to do that. Summary  An echocardiogram is a procedure that uses painless sound waves (ultrasound) to produce an image of the  heart.  Images from an echocardiogram can provide important information about the size and shape of your heart, heart muscle function, heart valve function, and fluid buildup around your heart.  You do not need to do anything to prepare before this procedure. You may eat and drink normally.  After the echocardiogram is completed, you may return to your normal, everyday life, unless your health care provider tells you not to do that. This information is not intended to replace advice given to you by your health care provider. Make sure you discuss any questions you have with your health care provider. Document Revised: 01/15/2019 Document Reviewed: 10/27/2016 Elsevier Patient Education  [place].

## 2019-10-23 NOTE — Progress Notes (Signed)
Cardiology Office Note:    Date:  10/23/2019   ID:  Erin Andrade, DOB 03-22-1961, MRN [id number]  PCP:  Raina Mina., MD  Cardiologist:  Berniece Salines, DO  Electrophysiologist:  None   Referring MD: Raina Mina., MD    The patient is referred for palpitations.   History of Present Illness:    Erin Andrade is a 59 y.o. female with a hx of bicuspid aortic valve, anxiety, hypertension, hyperlipidemia presents today to be evaluated for palpitations.  Patient tells me over the last several weeks she has been experiencing palpitations.  She described as a fast onset of her heart rate lasted for few seconds prior to resolution.  She tells me that the increase of her blood pressure medication losartan from 50mg  to 100 mg this hypertension has resolved.  She is not experiencing complications as she did.  Denies any chest pain, shortness of breath, nausea or vomiting.  No other complaints at this time.   Past Medical History:  Diagnosis Date  . Abnormal Pap smear of cervix 2008?   HPV  . 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 Anxiety   . Aortic valve disorder 05/07/2016  . Bicuspid aortic valve 05/07/2016   Last Assessment & Plan:  Reviewed her last [place] she had which was stable for her  . Chronic idiopathic gout of multiple sites 05/15/2016   Last Assessment & Plan:  Increase this uloric to 80 mg daily for her and she can use the colchicine for flares hopefully rather than try to take daily  . Chronic recurrent major depressive disorder ([place]) 02/17/2016   Last Assessment & Plan:  Relevant Hx: Course: Daily Update: Today's Plan:increase the zoloft for her  Electronically signed by: Mayer Camel, NP 02/20/16 1107  . Colon polyp    at age 76  . Encounter for long-term current use of high risk medication 02/17/2016  . Essential hypertension 02/17/2016   Last Assessment & Plan:  Relevant Hx: Course: Daily Update: Today's Plan:better on recheck for her continue with her current  dose of med  Electronically signed by: Mayer Camel, NP 02/20/16 1103  . Family history of colon cancer   . Family history of ovarian cancer   . GERD (gastroesophageal reflux disease)   . Hallux valgus, acquired 05/04/2016  . History of kidney stones   . Hyperlipidemia   . Hypertension   . Hypokalemia 02/17/2016  . Hypothyroidism (acquired) 02/17/2016   Last Assessment & Plan:  Relevant Hx: Course: Daily Update: Today's Plan:she has been stable in the past continue to follow  Electronically signed by: Mayer Camel, NP 02/20/16 1103  . Kidney stone 05/07/2016  . Localized edema 02/17/2016   Last Assessment & Plan:  Relevant Hx: Course: Daily Update: Today's Plan:is stable with her torsemide but today has increased due to her foot pain and presumed gout  Electronically signed by: Mayer Camel, NP 02/20/16 1107  . Malaise and fatigue 02/17/2016   Last Assessment & Plan:  Relevant Hx: Course: Daily Update: Today's Plan:this is off and on for her and will follow this for her and discussed her mood as she is not sure that the zoloft increase will not help her more and will do this for her  Electronically signed by: Mayer Camel, NP 02/20/16 2055  . Mixed hyperlipidemia 02/17/2016   Last Assessment & Plan:  Relevant Hx: Course: Daily Update: Today's Plan:not taking her med as she should be due to having had  increased myalgias and will continue to follow this for her and see where her level is at this time  Electronically signed by: Mayer Camel, NP 02/20/16 1104  . Palpitation 10/13/2019  . Primary insomnia 02/17/2016   Last Assessment & Plan:  Relevant Hx: Course: Daily Update: Today's Plan:this is stable for her  Electronically signed by: Mayer Camel, NP 02/20/16 1106  . Thyroid disease   . Vitamin D deficiency 02/17/2016    Past Surgical History:  Procedure Laterality Date  . CERVICAL BIOPSY  W/ LOOP ELECTRODE EXCISION   2007   CIN II - Dr. Quincy Simmonds  . CHOLECYSTECTOMY    . COLPOSCOPY  2008  . PARATHYROIDECTOMY    . TONSILLECTOMY      Current Medications: Current Meds  Medication Sig  . allopurinol (ZYLOPRIM) 300 MG tablet Take 300 mg by mouth daily.  Marland Kitchen atorvastatin (LIPITOR) 20 MG tablet   . esomeprazole (NEXIUM) 40 MG capsule   . losartan (COZAAR) 100 MG tablet 100 mg daily.  . potassium chloride (K-DUR) 10 MEQ tablet TAKE 1 TABLET BY MOUTH DAILY  . sertraline (ZOLOFT) 50 MG tablet   . torsemide (DEMADEX) 20 MG tablet Take 20 mg by mouth daily.      Allergies:   Penicillins   Social History   Socioeconomic History  . Marital status: Married    Spouse name: Not on file  . Number of children: 0  . Years of education: Not on file  . Highest education level: Not on file  Occupational History  . Not on file  Tobacco Use  . Smoking status: Never Smoker  . Smokeless tobacco: Never Used  Substance and Sexual Activity  . Alcohol use: No    Alcohol/week: 0.0 standard drinks  . Drug use: No  . Sexual activity: Not Currently    Partners: Male  Other Topics Concern  . Not on file  Social History Narrative  . Not on file   Social Determinants of Health   Financial Resource Strain:   . Difficulty of Paying Living Expenses: Not on file  Food Insecurity:   . Worried About Charity fundraiser in the Last Year: Not on file  . Ran Out of Food in the Last Year: Not on file  Transportation Needs:   . Lack of Transportation (Medical): Not on file  . Lack of Transportation (Non-Medical): Not on file  Physical Activity:   . Days of Exercise per Week: Not on file  . Minutes of Exercise per Session: Not on file  Stress:   . Feeling of Stress : Not on file  Social Connections:   . Frequency of Communication with Friends and Family: Not on file  . Frequency of Social Gatherings with Friends and Family: Not on file  . Attends Religious Services: Not on file  . Active Member of Clubs or Organizations:  Not on file  . Attends Archivist Meetings: Not on file  . Marital Status: Not on file     Family History: The patient's family history includes Colon cancer (age of onset: 69) in her mother; Heart attack in her father, maternal grandfather, and paternal grandfather; Hypertension in her father; Kidney cancer in her maternal uncle; Leukemia (age of onset: 33) in her father; Ovarian cancer in her maternal aunt; Ovarian cancer (age of onset: 59) in her maternal grandmother. There is no history of Breast cancer.  ROS:   Review of Systems  Constitution: Negative for decreased appetite, fever and  weight gain.  HENT: Negative for congestion, ear discharge, hoarse voice and sore throat.   Eyes: Negative for discharge, redness, vision loss in right eye and visual halos.  Cardiovascular: Negative for chest pain, dyspnea on exertion, leg swelling, orthopnea and palpitations.  Respiratory: Negative for cough, hemoptysis, shortness of breath and snoring.   Endocrine: Negative for heat intolerance and polyphagia.  Hematologic/Lymphatic: Negative for bleeding problem. Does not bruise/bleed easily.  Skin: Negative for flushing, nail changes, rash and suspicious lesions.  Musculoskeletal: Negative for arthritis, joint pain, muscle cramps, myalgias, neck pain and stiffness.  Gastrointestinal: Negative for abdominal pain, bowel incontinence, diarrhea and excessive appetite.  Genitourinary: Negative for decreased libido, genital sores and incomplete emptying.  Neurological: Negative for brief paralysis, focal weakness, headaches and loss of balance.  Psychiatric/Behavioral: Negative for altered mental status, depression and suicidal ideas.  Allergic/Immunologic: Negative for HIV exposure and persistent infections.    EKGs/Labs/Other Studies Reviewed:    The following studies were reviewed today:   EKG:  The ekg ordered today demonstrates sinus rhythm, heart rate 66 bpm.  Recent Labs: No  results found for requested labs within last 8760 hours.  Recent Lipid Panel No results found for: CHOL, TRIG, HDL, CHOLHDL, VLDL, LDLCALC, LDLDIRECT  Physical Exam:    VS:  BP 140/82 (BP Location: Left Arm, Patient Position: Sitting, Cuff Size: Normal)   Pulse 69   Ht 5\' 3"  (1.6 m)   Wt 200 lb (90.7 kg)   LMP 03/09/2015 (Within Months)   SpO2 100%   BMI 35.43 kg/m     Wt Readings from Last 3 Encounters:  10/23/19 200 lb (90.7 kg)  06/18/19 197 lb 9.6 oz (89.6 kg)  04/22/19 196 lb 6.4 oz (89.1 kg)     GEN: Well nourished, well developed in no acute distress HEENT: Normal NECK: No JVD; No carotid bruits LYMPHATICS: No lymphadenopathy CARDIAC: S1S2 noted,RRR, 3/6 ejection systolic murmurs, rubs, gallops RESPIRATORY:  Clear to auscultation without rales, wheezing or rhonchi  ABDOMEN: Soft, non-tender, non-distended, +bowel sounds, no guarding. EXTREMITIES: No edema, No cyanosis, no clubbing MUSCULOSKELETAL:  No edema; No deformity  SKIN: Warm and dry NEUROLOGIC:  Alert and oriented x 3, non-focal PSYCHIATRIC:  Normal affect, good insight  ASSESSMENT:    1. Essential hypertension   2. Mixed hyperlipidemia   3. Palpitation   4. Obesity (BMI 30-39.9)   5. Bicuspid aortic valve    PLAN:    She is hypertensive in the office today she is currently on losartan 100 mg daily along with torsemide 20 mg as needed.  I would get her started patient on low-dose beta-blocker-Coreg 3.125 mg twice daily.  Does have known bicuspid valve and has not had a echocardiogram and many years therefore I am going to order one today to reassess her valve.  Continue patient on her atorvastatin 20 mg daily.  Her palpitations seems to have resolved, will continue to monitor this will be that the addition of a beta-blocker also help with controlling symptoms.  If needed in the future if her symptoms does not resolve we will place an ambulatory monitor the patient.  The patient is in agreement with  the above plan. The patient left the office in stable condition.  The patient will follow up in 1 month for blood pressure check.  Medication Adjustments/Labs and Tests Ordered: Current medicines are reviewed at length with the patient today.  Concerns regarding medicines are outlined above.  Orders Placed This Encounter  Procedures  . EKG  12-Lead  . ECHOCARDIOGRAM COMPLETE   Meds ordered this encounter  Medications  . carvedilol (COREG) 3.125 MG tablet    Sig: Take 1 tablet (3.125 mg total) by mouth 2 (two) times daily.    Dispense:  180 tablet    Refill:  1    Patient Instructions  Medication Instructions:  Your physician has recommended you make the following change in your medication:   START: COreg(carvedilol) 3.125 mg Take 1 tab twice daily  *If you need a refill on your cardiac medications before your next appointment, please call your pharmacy*  Lab Work: NOne If you have labs (blood work) drawn today and your tests are completely normal, you will receive your results only by: Marland Kitchen MyChart Message (if you have MyChart) OR . A paper copy in the mail If you have any lab test that is abnormal or we need to change your treatment, we will call you to review the results.  Testing/Procedures: Your physician has requested that you have an echocardiogram. Echocardiography is a painless test that uses sound waves to create images of your heart. It provides your doctor with information about the size and shape of your heart and how well your heart's chambers and valves are working. This procedure takes approximately one hour. There are no restrictions for this procedure.    Follow-Up: At [facility], you and your health needs are our priority.  As part of our continuing mission to provide you with exceptional heart care, we have created designated Provider Care Teams.  These Care Teams include your primary Cardiologist (physician) and Advanced Practice Providers (APPs -  Physician  Assistants and Nurse Practitioners) who all work together to provide you with the care you need, when you need it.  Your next appointment:   1 month(s)  The format for your next appointment:   In Person  Provider:   Berniece Salines, DO  Other Instructions  Echocardiogram An echocardiogram is a procedure that uses painless sound waves (ultrasound) to produce an image of the heart. Images from an echocardiogram can provide important information about:  Signs of coronary artery disease (CAD).  Aneurysm detection. An aneurysm is a weak or damaged part of an artery wall that bulges out from the normal force of blood pumping through the body.  Heart size and shape. Changes in the size or shape of the heart can be associated with certain conditions, including heart failure, aneurysm, and CAD.  Heart muscle function.  Heart valve function.  Signs of a past heart attack.  Fluid buildup around the heart.  Thickening of the heart muscle.  A tumor or infectious growth around the heart valves. Tell a health care provider about:  Any allergies you have.  All medicines you are taking, including vitamins, herbs, eye drops, creams, and over-the-counter medicines.  Any blood disorders you have.  Any surgeries you have had.  Any medical conditions you have.  Whether you are pregnant or may be pregnant. What are the risks? Generally, this is a safe procedure. However, problems may occur, including:  Allergic reaction to dye (contrast) that may be used during the procedure. What happens before the procedure? No specific preparation is needed. You may eat and drink normally. What happens during the procedure?   An IV tube may be inserted into one of your veins.  You may receive contrast through this tube. A contrast is an injection that improves the quality of the pictures from your heart.  A gel will be applied  to your chest.  A wand-like tool (transducer) will be moved over your  chest. The gel will help to transmit the sound waves from the transducer.  The sound waves will harmlessly bounce off of your heart to allow the heart images to be captured in real-time motion. The images will be recorded on a computer. The procedure may vary among health care providers and hospitals. What happens after the procedure?  You may return to your normal, everyday life, including diet, activities, and medicines, unless your health care provider tells you not to do that. Summary  An echocardiogram is a procedure that uses painless sound waves (ultrasound) to produce an image of the heart.  Images from an echocardiogram can provide important information about the size and shape of your heart, heart muscle function, heart valve function, and fluid buildup around your heart.  You do not need to do anything to prepare before this procedure. You may eat and drink normally.  After the echocardiogram is completed, you may return to your normal, everyday life, unless your health care provider tells you not to do that. This information is not intended to replace advice given to you by your health care provider. Make sure you discuss any questions you have with your health care provider. Document Revised: 01/15/2019 Document Reviewed: 10/27/2016 Elsevier Patient Education  [place].      Adopting a Healthy Lifestyle.  Know what a healthy weight is for you (roughly BMI <25) and aim to maintain this   Aim for 7+ servings of fruits and vegetables daily   65-80+ fluid ounces of water or unsweet tea for healthy kidneys   Limit to max 1 drink of alcohol per day; avoid smoking/tobacco   Limit animal fats in diet for cholesterol and heart health - choose grass fed whenever available   Avoid highly processed foods, and foods high in saturated/trans fats   Aim for low stress - take time to unwind and care for your mental health   Aim for 150 min of moderate intensity exercise  weekly for heart health, and weights twice weekly for bone health   Aim for 7-9 hours of sleep daily   When it comes to diets, agreement about the perfect plan isnt easy to find, even among the experts. Experts at the [employer] developed an idea known as the Healthy Eating Plate. Just imagine a plate divided into logical, healthy portions.   The emphasis is on diet quality:   Load up on vegetables and fruits - one-half of your plate: Aim for color and variety, and remember that potatoes dont count.   Go for whole grains - one-quarter of your plate: Whole wheat, barley, wheat berries, quinoa, oats, brown rice, and foods made with them. If you want pasta, go with whole wheat pasta.   Protein power - one-quarter of your plate: Fish, chicken, beans, and nuts are all healthy, versatile protein sources. Limit red meat.   The diet, however, does go beyond the plate, offering a few other suggestions.   Use healthy plant oils, such as olive, canola, soy, corn, sunflower and peanut. Check the labels, and avoid partially hydrogenated oil, which have unhealthy trans fats.   If youre thirsty, drink water. Coffee and tea are good in moderation, but skip sugary drinks and limit milk and dairy products to one or two daily servings.   The type of carbohydrate in the diet is more important than the amount. Some sources of carbohydrates,  such as vegetables, fruits, whole grains, and beans-are healthier than others.   Finally, stay active  Signed, Berniece Salines, DO  10/23/2019 3:32 PM    [facility]

## 2019-11-27 ENCOUNTER — Other Ambulatory Visit: Payer: Self-pay

## 2019-11-27 ENCOUNTER — Ambulatory Visit (INDEPENDENT_AMBULATORY_CARE_PROVIDER_SITE_OTHER): Payer: BC Managed Care – PPO | Admitting: Cardiology

## 2019-11-27 ENCOUNTER — Encounter: Payer: Self-pay | Admitting: Cardiology

## 2019-11-27 VITALS — BP 128/72 | HR 71 | Ht 63.0 in | Wt 198.0 lb

## 2019-11-27 DIAGNOSIS — Q231 Congenital insufficiency of aortic valve: Secondary | ICD-10-CM | POA: Diagnosis not present

## 2019-11-27 DIAGNOSIS — I10 Essential (primary) hypertension: Secondary | ICD-10-CM

## 2019-11-27 DIAGNOSIS — E782 Mixed hyperlipidemia: Secondary | ICD-10-CM

## 2019-11-27 NOTE — Patient Instructions (Signed)
Medication Instructions:  Your physician recommends that you continue on your current medications as directed. Please refer to the Current Medication list given to you today.  *If you need a refill on your cardiac medications before your next appointment, please call your pharmacy*  Lab Work: None If you have labs (blood work) drawn today and your tests are completely normal, you will receive your results only by: . MyChart Message (if you have MyChart) OR . A paper copy in the mail If you have any lab test that is abnormal or we need to change your treatment, we will call you to review the results.  Testing/Procedures: None  Follow-Up: At [facility], you and your health needs are our priority.  As part of our continuing mission to provide you with exceptional heart care, we have created designated Provider Care Teams.  These Care Teams include your primary Cardiologist (physician) and Advanced Practice Providers (APPs -  Physician Assistants and Nurse Practitioners) who all work together to provide you with the care you need, when you need it.  Your next appointment:   6 month(s)  The format for your next appointment:   In Person  Provider:   Kardie Tobb, DO  Other Instructions   

## 2019-11-27 NOTE — Progress Notes (Signed)
Cardiology Office Note:    Date:  11/28/2019   ID:  Erin Andrade, DOB [birth year], MRN [id number]  PCP:  Raina Mina., MD  Cardiologist:  Berniece Salines, DO  Electrophysiologist:  None   Referring MD: Raina Mina., MD   Chief Complaint  Patient presents with  . Follow-up    History of Present Illness:    Erin Andrade is a 59 y.o. female with a hx of bicuspid aortic valve, anxiety, hypertension, hyperlipidemia presents today for follow-up visit.  This is a patient of January 15,2021 at the time to complete she was experiencing palpitations.  During her visit I added a lot of 3.125 mg twice a day to her medication regimen due to her increased blood pressure and her palpitations.  I also ordered a transthoracic echocardiogram to assess a bicuspid valve.  Today for follow-up visit she offers no complaints at this time.  She tells me she is doing better since starting her beta-blocker.  Past Medical History:  Diagnosis Date  . Abnormal Pap smear of cervix 2008?   HPV  . 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 Anxiety   . Aortic valve disorder 05/07/2016  . Bicuspid aortic valve 05/07/2016   Last Assessment & Plan:  Reviewed her last [place] she had which was stable for her  . Chronic idiopathic gout of multiple sites 05/15/2016   Last Assessment & Plan:  Increase this uloric to 80 mg daily for her and she can use the colchicine for flares hopefully rather than try to take daily  . Chronic recurrent major depressive disorder ([place]) 02/17/2016   Last Assessment & Plan:  Relevant Hx: Course: Daily Update: Today's Plan:increase the zoloft for her  Electronically signed by: Mayer Camel, NP 02/20/16 1107  . Colon polyp    at age 68  . Encounter for long-term current use of high risk medication 02/17/2016  . Essential hypertension 02/17/2016   Last Assessment & Plan:  Relevant Hx: Course: Daily Update: Today's Plan:better on recheck for her continue with her current dose of med   Electronically signed by: Mayer Camel, NP 02/20/16 1103  . Family history of colon cancer   . Family history of ovarian cancer   . GERD (gastroesophageal reflux disease)   . Hallux valgus, acquired 05/04/2016  . History of kidney stones   . Hyperlipidemia   . Hypertension   . Hypokalemia 02/17/2016  . Hypothyroidism (acquired) 02/17/2016   Last Assessment & Plan:  Relevant Hx: Course: Daily Update: Today's Plan:she has been stable in the past continue to follow  Electronically signed by: Mayer Camel, NP 02/20/16 1103  . Kidney stone 05/07/2016  . Localized edema 02/17/2016   Last Assessment & Plan:  Relevant Hx: Course: Daily Update: Today's Plan:is stable with her torsemide but today has increased due to her foot pain and presumed gout  Electronically signed by: Mayer Camel, NP 02/20/16 1107  . Malaise and fatigue 02/17/2016   Last Assessment & Plan:  Relevant Hx: Course: Daily Update: Today's Plan:this is off and on for her and will follow this for her and discussed her mood as she is not sure that the zoloft increase will not help her more and will do this for her  Electronically signed by: Mayer Camel, NP 02/20/16 2055  . Mixed hyperlipidemia 02/17/2016   Last Assessment & Plan:  Relevant Hx: Course: Daily Update: Today's Plan:not taking her med as she should be due to having had increased  myalgias and will continue to follow this for her and see where her level is at this time  Electronically signed by: Mayer Camel, NP 02/20/16 1104  . Palpitation 10/13/2019  . Primary insomnia 02/17/2016   Last Assessment & Plan:  Relevant Hx: Course: Daily Update: Today's Plan:this is stable for her  Electronically signed by: Mayer Camel, NP 02/20/16 1106  . Thyroid disease   . Vitamin D deficiency 02/17/2016    Past Surgical History:  Procedure Laterality Date  . CERVICAL BIOPSY  W/ LOOP ELECTRODE EXCISION  2007   CIN  II - Dr. Quincy Simmonds  . CHOLECYSTECTOMY    . COLPOSCOPY  2008  . PARATHYROIDECTOMY    . TONSILLECTOMY      Current Medications: Current Meds  Medication Sig  . allopurinol (ZYLOPRIM) 300 MG tablet Take 300 mg by mouth daily.  Marland Kitchen atorvastatin (LIPITOR) 20 MG tablet   . carvedilol (COREG) 3.125 MG tablet Take 1 tablet (3.125 mg total) by mouth 2 (two) times daily.  Marland Kitchen esomeprazole (NEXIUM) 40 MG capsule   . losartan (COZAAR) 100 MG tablet 100 mg daily.  . potassium chloride (K-DUR) 10 MEQ tablet TAKE 1 TABLET BY MOUTH DAILY  . sertraline (ZOLOFT) 50 MG tablet   . torsemide (DEMADEX) 20 MG tablet Take 20 mg by mouth daily.      Allergies:   Penicillins   Social History   Socioeconomic History  . Marital status: Married    Spouse name: Not on file  . Number of children: 0  . Years of education: Not on file  . Highest education level: Not on file  Occupational History  . Not on file  Tobacco Use  . Smoking status: Never Smoker  . Smokeless tobacco: Never Used  Substance and Sexual Activity  . Alcohol use: No    Alcohol/week: 0.0 standard drinks  . Drug use: No  . Sexual activity: Not Currently    Partners: Male  Other Topics Concern  . Not on file  Social History Narrative  . Not on file   Social Determinants of Health   Financial Resource Strain:   . Difficulty of Paying Living Expenses: Not on file  Food Insecurity:   . Worried About Charity fundraiser in the Last Year: Not on file  . Ran Out of Food in the Last Year: Not on file  Transportation Needs:   . Lack of Transportation (Medical): Not on file  . Lack of Transportation (Non-Medical): Not on file  Physical Activity:   . Days of Exercise per Week: Not on file  . Minutes of Exercise per Session: Not on file  Stress:   . Feeling of Stress : Not on file  Social Connections:   . Frequency of Communication with Friends and Family: Not on file  . Frequency of Social Gatherings with Friends and Family: Not on file   . Attends Religious Services: Not on file  . Active Member of Clubs or Organizations: Not on file  . Attends Archivist Meetings: Not on file  . Marital Status: Not on file     Family History: The patient's family history includes Colon cancer (age of onset: 8) in her mother; Heart attack in her father, maternal grandfather, and paternal grandfather; Hypertension in her father; Kidney cancer in her maternal uncle; Leukemia (age of onset: 27) in her father; Ovarian cancer in her maternal aunt; Ovarian cancer (age of onset: 43) in her maternal grandmother. There is no history  of Breast cancer.  ROS:   Review of Systems  Constitution: Negative for decreased appetite, fever and weight gain.  HENT: Negative for congestion, ear discharge, hoarse voice and sore throat.   Eyes: Negative for discharge, redness, vision loss in right eye and visual halos.  Cardiovascular: Negative for chest pain, dyspnea on exertion, leg swelling, orthopnea and palpitations.  Respiratory: Negative for cough, hemoptysis, shortness of breath and snoring.   Endocrine: Negative for heat intolerance and polyphagia.  Hematologic/Lymphatic: Negative for bleeding problem. Does not bruise/bleed easily.  Skin: Negative for flushing, nail changes, rash and suspicious lesions.  Musculoskeletal: Negative for arthritis, joint pain, muscle cramps, myalgias, neck pain and stiffness.  Gastrointestinal: Negative for abdominal pain, bowel incontinence, diarrhea and excessive appetite.  Genitourinary: Negative for decreased libido, genital sores and incomplete emptying.  Neurological: Negative for brief paralysis, focal weakness, headaches and loss of balance.  Psychiatric/Behavioral: Negative for altered mental status, depression and suicidal ideas.  Allergic/Immunologic: Negative for HIV exposure and persistent infections.    EKGs/Labs/Other Studies Reviewed:    The following studies were reviewed today:   EKG: None  today  Recent Labs: No results found for requested labs within last 8760 hours.  Recent Lipid Panel No results found for: CHOL, TRIG, HDL, CHOLHDL, VLDL, LDLCALC, LDLDIRECT  Physical Exam:    VS:  BP 128/72 (BP Location: Left Arm, Patient Position: Sitting, Cuff Size: Normal)   Pulse 71   Ht 5\' 3"  (1.6 m)   Wt 198 lb (89.8 kg)   LMP 03/09/2015 (Within Months)   SpO2 96%   BMI 35.07 kg/m     Wt Readings from Last 3 Encounters:  11/27/19 198 lb (89.8 kg)  10/23/19 200 lb (90.7 kg)  06/18/19 197 lb 9.6 oz (89.6 kg)     GEN: Well nourished, well developed in no acute distress HEENT: Normal NECK: No JVD; No carotid bruits LYMPHATICS: No lymphadenopathy CARDIAC: S1S2 noted,RRR, no murmurs, rubs, gallops RESPIRATORY:  Clear to auscultation without rales, wheezing or rhonchi  ABDOMEN: Soft, non-tender, non-distended, +bowel sounds, no guarding. EXTREMITIES: No edema, No cyanosis, no clubbing MUSCULOSKELETAL:  No deformity  SKIN: Warm and dry NEUROLOGIC:  Alert and oriented x 3, non-focal PSYCHIATRIC:  Normal affect, good insight  ASSESSMENT:    1. Essential hypertension   2. Bicuspid aortic valve   3. Mixed hyperlipidemia    PLAN:    1.  Symptoms has been improved, hypertension also has improved, current regimen includes carvedilol 3.125 mg twice daily, losartan 100 mg daily,  Torsemide 20 mg daily - we will continue this regimen.  2.  Continue patient on high atorvastatin 10 mg daily for hyperlipidemia.  Transthoracic echocardiogram is pending for December 18, 2019.  The patient is in agreement with the above plan. The patient left the office in stable condition.  The patient will follow up in 6 months or sooner if needed.   Medication Adjustments/Labs and Tests Ordered: Current medicines are reviewed at length with the patient today.  Concerns regarding medicines are outlined above.  No orders of the defined types were placed in this encounter.  No orders of the  defined types were placed in this encounter.   Patient Instructions  Medication Instructions:  Your physician recommends that you continue on your current medications as directed. Please refer to the Current Medication list given to you today.  *If you need a refill on your cardiac medications before your next appointment, please call your pharmacy*  Lab Work: None  If  you have labs (blood work) drawn today and your tests are completely normal, you will receive your results only by: Marland Kitchen MyChart Message (if you have MyChart) OR . A paper copy in the mail If you have any lab test that is abnormal or we need to change your treatment, we will call you to review the results.  Testing/Procedures: None  Follow-Up: At [facility], you and your health needs are our priority.  As part of our continuing mission to provide you with exceptional heart care, we have created designated Provider Care Teams.  These Care Teams include your primary Cardiologist (physician) and Advanced Practice Providers (APPs -  Physician Assistants and Nurse Practitioners) who all work together to provide you with the care you need, when you need it.  Your next appointment:   6 month(s)  The format for your next appointment:   In Person  Provider:   Berniece Salines, DO  Other Instructions      Adopting a Healthy Lifestyle.  Know what a healthy weight is for you (roughly BMI <25) and aim to maintain this   Aim for 7+ servings of fruits and vegetables daily   65-80+ fluid ounces of water or unsweet tea for healthy kidneys   Limit to max 1 drink of alcohol per day; avoid smoking/tobacco   Limit animal fats in diet for cholesterol and heart health - choose grass fed whenever available   Avoid highly processed foods, and foods high in saturated/trans fats   Aim for low stress - take time to unwind and care for your mental health   Aim for 150 min of moderate intensity exercise weekly for heart health, and  weights twice weekly for bone health   Aim for 7-9 hours of sleep daily   When it comes to diets, agreement about the perfect plan isnt easy to find, even among the experts. Experts at the [employer] developed an idea known as the Healthy Eating Plate. Just imagine a plate divided into logical, healthy portions.   The emphasis is on diet quality:   Load up on vegetables and fruits - one-half of your plate: Aim for color and variety, and remember that potatoes dont count.   Go for whole grains - one-quarter of your plate: Whole wheat, barley, wheat berries, quinoa, oats, brown rice, and foods made with them. If you want pasta, go with whole wheat pasta.   Protein power - one-quarter of your plate: Fish, chicken, beans, and nuts are all healthy, versatile protein sources. Limit red meat.   The diet, however, does go beyond the plate, offering a few other suggestions.   Use healthy plant oils, such as olive, canola, soy, corn, sunflower and peanut. Check the labels, and avoid partially hydrogenated oil, which have unhealthy trans fats.   If youre thirsty, drink water. Coffee and tea are good in moderation, but skip sugary drinks and limit milk and dairy products to one or two daily servings.   The type of carbohydrate in the diet is more important than the amount. Some sources of carbohydrates, such as vegetables, fruits, whole grains, and beans-are healthier than others.   Finally, stay active  Signed, Berniece Salines, DO  11/28/2019 9:21 AM    [place]

## 2019-12-18 ENCOUNTER — Ambulatory Visit (INDEPENDENT_AMBULATORY_CARE_PROVIDER_SITE_OTHER): Payer: BC Managed Care – PPO

## 2019-12-18 ENCOUNTER — Other Ambulatory Visit: Payer: Self-pay

## 2019-12-18 DIAGNOSIS — R002 Palpitations: Secondary | ICD-10-CM

## 2019-12-18 DIAGNOSIS — I10 Essential (primary) hypertension: Secondary | ICD-10-CM

## 2019-12-18 DIAGNOSIS — Q231 Congenital insufficiency of aortic valve: Secondary | ICD-10-CM

## 2019-12-18 NOTE — Progress Notes (Unsigned)
Complete echocardiogram has been performed.  Jimmy Chayna Surratt RDCS, RVT 

## 2020-01-25 ENCOUNTER — Other Ambulatory Visit: Payer: Self-pay | Admitting: Cardiology

## 2020-02-29 DIAGNOSIS — N1831 Chronic kidney disease, stage 3a: Secondary | ICD-10-CM | POA: Insufficient documentation

## 2020-02-29 DIAGNOSIS — K760 Fatty (change of) liver, not elsewhere classified: Secondary | ICD-10-CM | POA: Insufficient documentation

## 2020-02-29 DIAGNOSIS — R7401 Elevation of levels of liver transaminase levels: Secondary | ICD-10-CM | POA: Insufficient documentation

## 2020-05-25 ENCOUNTER — Ambulatory Visit: Payer: BC Managed Care – PPO | Admitting: Obstetrics and Gynecology

## 2020-06-06 ENCOUNTER — Ambulatory Visit: Payer: BC Managed Care – PPO | Admitting: Cardiology

## 2020-06-06 ENCOUNTER — Other Ambulatory Visit: Payer: Self-pay

## 2020-06-06 ENCOUNTER — Ambulatory Visit (INDEPENDENT_AMBULATORY_CARE_PROVIDER_SITE_OTHER): Payer: BC Managed Care – PPO

## 2020-06-06 ENCOUNTER — Encounter: Payer: Self-pay | Admitting: Cardiology

## 2020-06-06 VITALS — BP 124/80 | HR 64 | Ht 63.0 in | Wt 197.6 lb

## 2020-06-06 DIAGNOSIS — R9431 Abnormal electrocardiogram [ECG] [EKG]: Secondary | ICD-10-CM

## 2020-06-06 DIAGNOSIS — R002 Palpitations: Secondary | ICD-10-CM | POA: Diagnosis not present

## 2020-06-06 DIAGNOSIS — Z79899 Other long term (current) drug therapy: Secondary | ICD-10-CM | POA: Diagnosis not present

## 2020-06-06 DIAGNOSIS — I10 Essential (primary) hypertension: Secondary | ICD-10-CM

## 2020-06-06 NOTE — Patient Instructions (Addendum)
Medication Instructions:   Your physician recommends that you continue on your current medications as directed. Please refer to the Current Medication list given to you today.  *If you need a refill on your cardiac medications before your next appointment, please call your pharmacy*   Lab Work:  BMET MAG TSH AND LFT TODAY    If you have labs (blood work) drawn today and your tests are completely normal, you will receive your results only by: Marland Kitchen MyChart Message (if you have MyChart) OR . A paper copy in the mail If you have any lab test that is abnormal or we need to change your treatment, we will call you to review the results.   Testing/Procedures: (FOR 7 DAYS ONLY AND MAIL MONITOR BACK ) Your physician has recommended that you wear an event monitor. Event monitors are medical devices that record the heart's electrical activity. Doctors most often [place] these monitors to diagnose arrhythmias. Arrhythmias are problems with the speed or rhythm of the heartbeat. The monitor is a small, portable device. You can wear one while you do your normal daily activities. This is usually used to diagnose what is causing palpitations/syncope (passing out).  Your physician has requested that you have a lexiscan myoview. For further information please visit [email or web address].[email or web address]. Please follow instruction sheet, as given.    Follow-Up: At [facility], you and your health needs are our priority.  As part of our continuing mission to provide you with exceptional heart care, we have created designated Provider Care Teams.  These Care Teams include your primary Cardiologist (physician) and Advanced Practice Providers (APPs -  Physician Assistants and Nurse Practitioners) who all work together to provide you with the care you need, when you need it.  We recommend signing up for the patient portal called "MyChart".  Sign up information is provided on this After Visit Summary.  MyChart is used to connect with  patients for Virtual Visits (Telemedicine).  Patients are able to view lab/test results, encounter notes, upcoming appointments, etc.  Non-urgent messages can be sent to your provider as well.   To learn more about what you can do with MyChart, go to [email or web address].    Your next appointment:   3 month(s)  The format for your next appointment:   In Person  Provider:   You will see Berniece Salines, DO.  Or, you can be scheduled with the following Advanced Practice Provider on your designated Care Team (at our [facility]):  Laurann Montana, FNP     Other Instructions

## 2020-06-06 NOTE — Progress Notes (Signed)
Cardiology Office Note:    Date:  06/06/2020   ID:  Erin Andrade, DOB 07-23-1961, MRN [id number]  PCP:  Raina Mina., MD  Cardiologist:  Berniece Salines, DO  Electrophysiologist:  None   Referring MD: Raina Mina., MD     History of Present Illness:    Erin Andrade is a 59 y.o. female with a hx of bicuspid aortic valve, anxiety, hypertension, hyperlipidemia presents today follow-up visit.  I last saw the patient November 27, 2019 at that time she appears to be doing well from a cardiovascular standpoint her beta-blocker was helping her palpitations.  She is here today she tells me that she has been experiencing intermittent palpitations.  She notes she gets abrupt onset of fast heartbeat which last for few seconds and it resolves.  She has had some shortness of breath but nothing really profound she says.  She suspects it may be related to possible anxiety.   Past Medical History:  Diagnosis Date   Abnormal Pap smear of cervix 2008?   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   Anxiety    Aortic valve disorder 05/07/2016   Bicuspid aortic valve 05/07/2016   Last Assessment & Plan:  Reviewed her last [place] she had which was stable for her   Chronic idiopathic gout of multiple sites 05/15/2016   Last Assessment & Plan:  Increase this uloric to 80 mg daily for her and she can use the colchicine for flares hopefully rather than try to take daily   Chronic recurrent major depressive disorder ([place]) 02/17/2016   Last Assessment & Plan:  Relevant Hx: Course: Daily Update: Today's Plan:increase the zoloft for her  Electronically signed by: Mayer Camel, NP 02/20/16 1107   Colon polyp    at age 62   Encounter for long-term current use of high risk medication 02/17/2016   Essential hypertension 02/17/2016   Last Assessment & Plan:  Relevant Hx: Course: Daily Update: Today's Plan:better on recheck for her continue with her current dose of med  Electronically signed  by: Mayer Camel, NP 02/20/16 1103   Family history of colon cancer    Family history of ovarian cancer    GERD (gastroesophageal reflux disease)    Hallux valgus, acquired 05/04/2016   History of kidney stones    Hyperlipidemia    Hypertension    Hypokalemia 02/17/2016   Hypothyroidism (acquired) 02/17/2016   Last Assessment & Plan:  Relevant Hx: Course: Daily Update: Today's Plan:she has been stable in the past continue to follow  Electronically signed by: Mayer Camel, NP 02/20/16 1103   Kidney stone 05/07/2016   Localized edema 02/17/2016   Last Assessment & Plan:  Relevant Hx: Course: Daily Update: Today's Plan:is stable with her torsemide but today has increased due to her foot pain and presumed gout  Electronically signed by: Mayer Camel, NP 02/20/16 1107   Malaise and fatigue 02/17/2016   Last Assessment & Plan:  Relevant Hx: Course: Daily Update: Today's Plan:this is off and on for her and will follow this for her and discussed her mood as she is not sure that the zoloft increase will not help her more and will do this for her  Electronically signed by: Mayer Camel, NP 02/20/16 2055   Mixed hyperlipidemia 02/17/2016   Last Assessment & Plan:  Relevant Hx: Course: Daily Update: Today's Plan:not taking her med as she should be due to having had increased myalgias and will continue to  follow this for her and see where her level is at this time  Electronically signed by: Mayer Camel, NP 02/20/16 1104   Palpitation 10/13/2019   Primary insomnia 02/17/2016   Last Assessment & Plan:  Relevant Hx: Course: Daily Update: Today's Plan:this is stable for her  Electronically signed by: Mayer Camel, NP 02/20/16 1106   Thyroid disease    Vitamin D deficiency 02/17/2016    Past Surgical History:  Procedure Laterality Date   CERVICAL BIOPSY  W/ LOOP ELECTRODE EXCISION  2007   CIN II - Dr. Quincy Simmonds    CHOLECYSTECTOMY     COLPOSCOPY  2008   PARATHYROIDECTOMY     TONSILLECTOMY      Current Medications: Current Meds  Medication Sig   allopurinol (ZYLOPRIM) 300 MG tablet Take 300 mg by mouth daily.   atorvastatin (LIPITOR) 20 MG tablet Take 20 mg by mouth at bedtime.   carvedilol (COREG) 3.125 MG tablet TAKE 1 TABLET(3.125 MG) BY MOUTH TWICE DAILY   esomeprazole (NEXIUM) 40 MG capsule    losartan (COZAAR) 100 MG tablet Take 100 mg by mouth daily.   potassium chloride (K-DUR) 10 MEQ tablet TAKE 1 TABLET BY MOUTH DAILY   sertraline (ZOLOFT) 50 MG tablet Take 1.5 tablets by mouth daily.   torsemide (DEMADEX) 20 MG tablet Take 1.5 tablets by mouth daily.     Allergies:   Penicillins   Social History   Socioeconomic History   Marital status: Married    Spouse name: Not on file   Number of children: 0   Years of education: Not on file   Highest education level: Not on file  Occupational History   Not on file  Tobacco Use   Smoking status: Never Smoker   Smokeless tobacco: Never Used  Vaping Use   Vaping Use: Never used  Substance and Sexual Activity   Alcohol use: No    Alcohol/week: 0.0 standard drinks   Drug use: No   Sexual activity: Not Currently    Partners: Male  Other Topics Concern   Not on file  Social History Narrative   Not on file   Social Determinants of Health   Financial Resource Strain:    Difficulty of Paying Living Expenses: Not on file  Food Insecurity:    Worried About [place] in the Last Year: Not on file   Ran Out of Food in the Last Year: Not on file  Transportation Needs:    Lack of Transportation (Medical): Not on file   Lack of Transportation (Non-Medical): Not on file  Physical Activity:    Days of Exercise per Week: Not on file   Minutes of Exercise per Session: Not on file  Stress:    Feeling of Stress : Not on file  Social Connections:    Frequency of Communication with Friends and  Family: Not on file   Frequency of Social Gatherings with Friends and Family: Not on file   Attends Religious Services: Not on file   Active Member of Clubs or Organizations: Not on file   Attends Archivist Meetings: Not on file   Marital Status: Not on file     Family History: The patient's family history includes Colon cancer (age of onset: 7) in her mother; Heart attack in her father, maternal grandfather, and paternal grandfather; Hypertension in her father; Kidney cancer in her maternal uncle; Leukemia (age of onset: 9) in her father; Ovarian cancer in her maternal aunt; Ovarian  cancer (age of onset: 68) in her maternal grandmother. There is no history of Breast cancer.  ROS:   Review of Systems  Constitution: Negative for decreased appetite, fever and weight gain.  HENT: Negative for congestion, ear discharge, hoarse voice and sore throat.   Eyes: Negative for discharge, redness, vision loss in right eye and visual halos.  Cardiovascular: Negative for chest pain, dyspnea on exertion, leg swelling, orthopnea and palpitations.  Respiratory: Negative for cough, hemoptysis, shortness of breath and snoring.   Endocrine: Negative for heat intolerance and polyphagia.  Hematologic/Lymphatic: Negative for bleeding problem. Does not bruise/bleed easily.  Skin: Negative for flushing, nail changes, rash and suspicious lesions.  Musculoskeletal: Negative for arthritis, joint pain, muscle cramps, myalgias, neck pain and stiffness.  Gastrointestinal: Negative for abdominal pain, bowel incontinence, diarrhea and excessive appetite.  Genitourinary: Negative for decreased libido, genital sores and incomplete emptying.  Neurological: Negative for brief paralysis, focal weakness, headaches and loss of balance.  Psychiatric/Behavioral: Negative for altered mental status, depression and suicidal ideas.  Allergic/Immunologic: Negative for HIV exposure and persistent infections.     EKGs/Labs/Other Studies Reviewed:    The following studies were reviewed today:   EKG:  The ekg ordered today demonstrates sinus rhythm, heart rate 64 bpm with worsening ST changes in her inferior leads cannot rule out inferior wall ischemia.  Compared to prior EKG ST depressions were present but slightly worsened at this time.  Echo impressions December 18, 2019 1. Left ventricular ejection fraction, by estimation, is 60 to 65%. The left ventricle has normal function. The left ventricle has no regional wall motion abnormalities. There is mild left ventricular hypertrophy.  Left ventricular diastolic parameters were normal.  2. Right ventricular systolic function is normal. The right ventricular size is normal. There is normal pulmonary artery systolic pressure.  3. The mitral valve is normal in structure. No evidence of mitral valve regurgitation. No evidence of mitral stenosis.  4. The aortic valve is normal in structure. Aortic valve regurgitation is mild. No aortic stenosis is present.  5. The inferior vena cava is normal in size with greater than 50% respiratory variability, suggesting right atrial pressure of 3 mmHg.   Recent Labs: No results found for requested labs within last 8760 hours.  Recent Lipid Panel No results found for: CHOL, TRIG, HDL, CHOLHDL, VLDL, LDLCALC, LDLDIRECT  Physical Exam:    VS:  BP 124/80    Pulse 64    Ht 5\' 3"  (1.6 m)    Wt 197 lb 9.6 oz (89.6 kg)    LMP 03/09/2015 (Within Months)    SpO2 98%    BMI 35.00 kg/m     Wt Readings from Last 3 Encounters:  06/06/20 197 lb 9.6 oz (89.6 kg)  11/27/19 198 lb (89.8 kg)  10/23/19 200 lb (90.7 kg)     GEN: Well nourished, well developed in no acute distress HEENT: Normal NECK: No JVD; No carotid bruits LYMPHATICS: No lymphadenopathy CARDIAC: S1S2 noted,RRR, no murmurs, rubs, gallops RESPIRATORY:  Clear to auscultation without rales, wheezing or rhonchi  ABDOMEN: Soft, non-tender, non-distended,  +bowel sounds, no guarding. EXTREMITIES: No edema, No cyanosis, no clubbing MUSCULOSKELETAL:  No deformity  SKIN: Warm and dry NEUROLOGIC:  Alert and oriented x 3, non-focal PSYCHIATRIC:  Normal affect, good insight  ASSESSMENT:    1. Palpitation   2. Essential hypertension   3. Nonspecific abnormal electrocardiogram (ECG) (EKG)   4. Medication management    PLAN:     Her EKG appears  to be worsened than prior.  At this time I like to pursue an ischemic evaluation in this patient with a pharmacologic nuclear stress test.  In addition given her palpitations I would like to rule out a cardiovascular etiology of this palpitation, therefore at this time I would like to placed a zio patch for   7 days. For now, I do reccomend that the patient goes to the nearest ED if  symptoms recur.  Her blood pressure is acceptable in the office today no changes will be made.  The patient understands the need to lose weight with diet and exercise. We have discussed specific strategies for this.  Blood work will also be done today to assess BMP, mag, LFTs in TSH.  The patient is in agreement with the above plan. The patient left the office in stable condition.  The patient will follow up in 3 months or sooner if needed    Medication Adjustments/Labs and Tests Ordered: Current medicines are reviewed at length with the patient today.  Concerns regarding medicines are outlined above.  Orders Placed This Encounter  Procedures   Basic Metabolic Panel (BMET)   Magnesium   TSH   Hepatic function panel   LONG TERM MONITOR (3-14 DAYS)   MYOCARDIAL PERFUSION IMAGING   EKG 12-Lead   No orders of the defined types were placed in this encounter.   Patient Instructions  Medication Instructions:   Your physician recommends that you continue on your current medications as directed. Please refer to the Current Medication list given to you today.  *If you need a refill on your cardiac medications  before your next appointment, please call your pharmacy*   Lab Work:  BMET MAG TSH AND LFT TODAY    If you have labs (blood work) drawn today and your tests are completely normal, you will receive your results only by:  [place] (if you have MyChart) OR  A paper copy in the mail If you have any lab test that is abnormal or we need to change your treatment, we will call you to review the results.   Testing/Procedures: (FOR 7 DAYS ONLY AND MAIL MONITOR BACK ) Your physician has recommended that you wear an event monitor. Event monitors are medical devices that record the hearts electrical activity. Doctors most often [place] these monitors to diagnose arrhythmias. Arrhythmias are problems with the speed or rhythm of the heartbeat. The monitor is a small, portable device. You can wear one while you do your normal daily activities. This is usually used to diagnose what is causing palpitations/syncope (passing out).  Your physician has requested that you have a lexiscan myoview. For further information please visit [email or web address]. Please follow instruction sheet, as given.    Follow-Up: At [facility], you and your health needs are our priority.  As part of our continuing mission to provide you with exceptional heart care, we have created designated Provider Care Teams.  These Care Teams include your primary Cardiologist (physician) and Advanced Practice Providers (APPs -  Physician Assistants and Nurse Practitioners) who all work together to provide you with the care you need, when you need it.  We recommend signing up for the patient portal called "MyChart".  Sign up information is provided on this After Visit Summary.  MyChart is used to connect with patients for Virtual Visits (Telemedicine).  Patients are able to view lab/test results, encounter notes, upcoming appointments, etc.  Non-urgent messages can be sent to your provider as well.  To learn more about what you can do with  MyChart, go to [email or web address].    Your next appointment:   3 month(s)  The format for your next appointment:   In Person  Provider:   You will see Berniece Salines, DO.  Or, you can be scheduled with the following Advanced Practice Provider on your designated Care Team (at our [facility]):  Laurann Montana, FNP     Other Instructions      Adopting a Healthy Lifestyle.  Know what a healthy weight is for you (roughly BMI <25) and aim to maintain this   Aim for 7+ servings of fruits and vegetables daily   65-80+ fluid ounces of water or unsweet tea for healthy kidneys   Limit to max 1 drink of alcohol per day; avoid smoking/tobacco   Limit animal fats in diet for cholesterol and heart health - choose grass fed whenever available   Avoid highly processed foods, and foods high in saturated/trans fats   Aim for low stress - take time to unwind and care for your mental health   Aim for 150 min of moderate intensity exercise weekly for heart health, and weights twice weekly for bone health   Aim for 7-9 hours of sleep daily   When it comes to diets, agreement about the perfect plan isnt easy to find, even among the experts. Experts at the [employer] developed an idea known as the Healthy Eating Plate. Just imagine a plate divided into logical, healthy portions.   The emphasis is on diet quality:   Load up on vegetables and fruits - one-half of your plate: Aim for color and variety, and remember that potatoes dont count.   Go for whole grains - one-quarter of your plate: Whole wheat, barley, wheat berries, quinoa, oats, brown rice, and foods made with them. If you want pasta, go with whole wheat pasta.   Protein power - one-quarter of your plate: Fish, chicken, beans, and nuts are all healthy, versatile protein sources. Limit red meat.   The diet, however, does go beyond the plate, offering a few other suggestions.   Use healthy plant oils,  such as olive, canola, soy, corn, sunflower and peanut. Check the labels, and avoid partially hydrogenated oil, which have unhealthy trans fats.   If youre thirsty, drink water. Coffee and tea are good in moderation, but skip sugary drinks and limit milk and dairy products to one or two daily servings.   The type of carbohydrate in the diet is more important than the amount. Some sources of carbohydrates, such as vegetables, fruits, whole grains, and beans-are healthier than others.   Finally, stay active  Signed, Berniece Salines, DO  06/06/2020 11:38 AM    [place]

## 2020-06-07 ENCOUNTER — Telehealth: Payer: Self-pay

## 2020-06-07 DIAGNOSIS — R748 Abnormal levels of other serum enzymes: Secondary | ICD-10-CM

## 2020-06-07 DIAGNOSIS — I10 Essential (primary) hypertension: Secondary | ICD-10-CM

## 2020-06-07 LAB — BASIC METABOLIC PANEL
BUN/Creatinine Ratio: 19 (ref 9–23)
BUN: 18 mg/dL (ref 6–24)
CO2: 26 mmol/L (ref 20–29)
Calcium: 9.3 mg/dL (ref 8.7–10.2)
Chloride: 100 mmol/L (ref 96–106)
Creatinine, Ser: 0.95 mg/dL (ref 0.57–1.00)
GFR calc Af Amer: 76 mL/min/{1.73_m2} (ref >59)
GFR calc non Af Amer: 66 mL/min/{1.73_m2} (ref >59)
Glucose: 104 mg/dL — ABNORMAL HIGH (ref 65–99)
Potassium: 4.2 mmol/L (ref 3.5–5.2)
Sodium: 141 mmol/L (ref 134–144)

## 2020-06-07 LAB — HEPATIC FUNCTION PANEL
ALT: 95 IU/L — ABNORMAL HIGH (ref 0–32)
AST: 82 IU/L — ABNORMAL HIGH (ref 0–40)
Albumin: 4.6 g/dL (ref 3.8–4.9)
Alkaline Phosphatase: 112 IU/L (ref 48–121)
Bilirubin Total: 0.3 mg/dL (ref 0.0–1.2)
Bilirubin, Direct: 0.1 mg/dL (ref 0.00–0.40)
Total Protein: 7.3 g/dL (ref 6.0–8.5)

## 2020-06-07 LAB — MAGNESIUM: Magnesium: 2.7 mg/dL — ABNORMAL HIGH (ref 1.6–2.3)

## 2020-06-07 LAB — TSH: TSH: 2.51 u[IU]/mL (ref 0.450–4.500)

## 2020-06-07 NOTE — Telephone Encounter (Signed)
Spoke with patient regarding results and recommendation.  Patient verbalizes understanding and is agreeable to plan of care. Advised patient to call back with any issues or concerns.  

## 2020-06-07 NOTE — Telephone Encounter (Signed)
-----   Message from Berniece Salines, DO sent at 06/07/2020  7:58 AM EDT ----- LFTs elevated AST 82, ALT 95, magnesium also slightly elevated at 2.7 other labs normal.  Please have her hold the Lipitor for now and come back and get LFTs in 2 weeks

## 2020-06-15 ENCOUNTER — Telehealth (HOSPITAL_COMMUNITY): Payer: Self-pay | Admitting: *Deleted

## 2020-06-15 NOTE — Telephone Encounter (Signed)
Patient given detailed instructions per Myocardial Perfusion Study Information Sheet for the test on 06/21/20 at 8:15. Patient notified to arrive 15 minutes early and that it is imperative to arrive on time for appointment to keep from having the test rescheduled.  If you need to cancel or reschedule your appointment, please call the office within 24 hours of your appointment. . Patient verbalized understanding.Erin Andrade

## 2020-06-21 ENCOUNTER — Other Ambulatory Visit: Payer: Self-pay

## 2020-06-21 ENCOUNTER — Ambulatory Visit (INDEPENDENT_AMBULATORY_CARE_PROVIDER_SITE_OTHER): Payer: BC Managed Care – PPO

## 2020-06-21 DIAGNOSIS — R002 Palpitations: Secondary | ICD-10-CM

## 2020-06-21 DIAGNOSIS — R9431 Abnormal electrocardiogram [ECG] [EKG]: Secondary | ICD-10-CM | POA: Diagnosis not present

## 2020-06-21 LAB — MYOCARDIAL PERFUSION IMAGING
LV dias vol: 85 mL (ref 46–106)
LV sys vol: 37 mL
Peak HR: 85 {beats}/min
Rest HR: 58 {beats}/min
SDS: 1
SRS: 2
SSS: 3
TID: 1.12

## 2020-06-21 MED ORDER — TECHNETIUM TC 99M TETROFOSMIN IV KIT
10.7000 | PACK | Freq: Once | INTRAVENOUS | Status: AC | PRN
  Administered 2020-06-21: 10.7 via INTRAVENOUS

## 2020-06-21 MED ORDER — REGADENOSON 0.4 MG/5ML IV SOLN
0.4000 mg | Freq: Once | INTRAVENOUS | Status: AC
  Administered 2020-06-21: 0.4 mg via INTRAVENOUS

## 2020-06-21 MED ORDER — TECHNETIUM TC 99M TETROFOSMIN IV KIT
31.1000 | PACK | Freq: Once | INTRAVENOUS | Status: AC | PRN
  Administered 2020-06-21: 31.1 via INTRAVENOUS

## 2020-06-22 ENCOUNTER — Telehealth: Payer: Self-pay

## 2020-06-22 NOTE — Telephone Encounter (Signed)
Spoke with patient regarding results and recommendation.  Patient verbalizes understanding and is agreeable to plan of care. Advised patient to call back with any issues or concerns.  

## 2020-06-22 NOTE — Telephone Encounter (Signed)
-----   Message from Berniece Salines, DO sent at 06/22/2020  1:01 PM EDT ----- Doristine Devoid news your stress test is normal.

## 2020-06-29 ENCOUNTER — Other Ambulatory Visit: Payer: Self-pay | Admitting: *Deleted

## 2020-06-29 ENCOUNTER — Other Ambulatory Visit: Payer: Self-pay

## 2020-06-29 ENCOUNTER — Encounter: Payer: Self-pay | Admitting: Cardiology

## 2020-06-29 ENCOUNTER — Ambulatory Visit: Payer: BC Managed Care – PPO | Admitting: Cardiology

## 2020-06-29 VITALS — BP 128/78 | HR 66 | Ht 63.0 in | Wt 197.4 lb

## 2020-06-29 DIAGNOSIS — I10 Essential (primary) hypertension: Secondary | ICD-10-CM

## 2020-06-29 DIAGNOSIS — R748 Abnormal levels of other serum enzymes: Secondary | ICD-10-CM

## 2020-06-29 DIAGNOSIS — I472 Ventricular tachycardia: Secondary | ICD-10-CM

## 2020-06-29 DIAGNOSIS — E782 Mixed hyperlipidemia: Secondary | ICD-10-CM

## 2020-06-29 DIAGNOSIS — I4729 Other ventricular tachycardia: Secondary | ICD-10-CM | POA: Insufficient documentation

## 2020-06-29 DIAGNOSIS — R5383 Other fatigue: Secondary | ICD-10-CM

## 2020-06-29 LAB — HEPATIC FUNCTION PANEL
ALT: 81 IU/L — ABNORMAL HIGH (ref 0–32)
AST: 46 IU/L — ABNORMAL HIGH (ref 0–40)
Albumin: 4.3 g/dL (ref 3.8–4.9)
Alkaline Phosphatase: 104 IU/L (ref 44–121)
Bilirubin Total: 0.4 mg/dL (ref 0.0–1.2)
Bilirubin, Direct: 0.13 mg/dL (ref 0.00–0.40)
Total Protein: 6.9 g/dL (ref 6.0–8.5)

## 2020-06-29 MED ORDER — CARVEDILOL 6.25 MG PO TABS: 6.2500 mg | ORAL_TABLET | Freq: Two times a day (BID) | ORAL | 3 refills | Status: DC

## 2020-06-29 NOTE — Progress Notes (Signed)
Cardiology Office Note:    Date:  06/29/2020   ID:  Erin Andrade, DOB 05/08/1961, MRN [id number]  PCP:  Raina Mina., MD  Cardiologist:  Berniece Salines, DO  Electrophysiologist:  None   Referring MD: Raina Mina., MD   Follow-up visit  History of Present Illness:    Erin Andrade is a 59 y.o. female with a hx of anxiety, hypertension, hyperlipidemia presents today for follow-up visit.  At her last visit I held her atorvastatin due to the fact that her LFTs were elevated.  In addition due to her chest pain I recommended nuclear stress test which was also reported to be normal.  She did wear her ZIO monitor due to significant palpitations.  She is here today to discuss these results.  Also reports that she has been experiencing significant fatigue and does snores at time due to what her husband had told her in the past.   Past Medical History:  Diagnosis Date  . Abnormal Pap smear of cervix 2008?   HPV  . 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 Anxiety   . Aortic valve disorder 05/07/2016  . Bicuspid aortic valve 05/07/2016   Last Assessment & Plan:  Reviewed her last [place] she had which was stable for her  . Chronic idiopathic gout of multiple sites 05/15/2016   Last Assessment & Plan:  Increase this uloric to 80 mg daily for her and she can use the colchicine for flares hopefully rather than try to take daily  . Chronic recurrent major depressive disorder ([place]) 02/17/2016   Last Assessment & Plan:  Relevant Hx: Course: Daily Update: Today's Plan:increase the zoloft for her  Electronically signed by: Mayer Camel, NP 02/20/16 1107  . Colon polyp    at age 53  . Encounter for long-term current use of high risk medication 02/17/2016  . Essential hypertension 02/17/2016   Last Assessment & Plan:  Relevant Hx: Course: Daily Update: Today's Plan:better on recheck for her continue with her current dose of med  Electronically signed by: Mayer Camel, NP 02/20/16  1103  . Family history of colon cancer   . Family history of ovarian cancer   . GERD (gastroesophageal reflux disease)   . Hallux valgus, acquired 05/04/2016  . History of kidney stones   . Hyperlipidemia   . Hypertension   . Hypokalemia 02/17/2016  . Hypothyroidism (acquired) 02/17/2016   Last Assessment & Plan:  Relevant Hx: Course: Daily Update: Today's Plan:she has been stable in the past continue to follow  Electronically signed by: Mayer Camel, NP 02/20/16 1103  . Kidney stone 05/07/2016  . Localized edema 02/17/2016   Last Assessment & Plan:  Relevant Hx: Course: Daily Update: Today's Plan:is stable with her torsemide but today has increased due to her foot pain and presumed gout  Electronically signed by: Mayer Camel, NP 02/20/16 1107  . Malaise and fatigue 02/17/2016   Last Assessment & Plan:  Relevant Hx: Course: Daily Update: Today's Plan:this is off and on for her and will follow this for her and discussed her mood as she is not sure that the zoloft increase will not help her more and will do this for her  Electronically signed by: Mayer Camel, NP 02/20/16 2055  . Mixed hyperlipidemia 02/17/2016   Last Assessment & Plan:  Relevant Hx: Course: Daily Update: Today's Plan:not taking her med as she should be due to having had increased myalgias and will continue to follow this  for her and see where her level is at this time  Electronically signed by: Mayer Camel, NP 02/20/16 1104  . Palpitation 10/13/2019  . Primary insomnia 02/17/2016   Last Assessment & Plan:  Relevant Hx: Course: Daily Update: Today's Plan:this is stable for her  Electronically signed by: Mayer Camel, NP 02/20/16 1106  . Thyroid disease   . Vitamin D deficiency 02/17/2016    Past Surgical History:  Procedure Laterality Date  . 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 CHOLECYSTECTOMY    . COLPOSCOPY  2008  .  PARATHYROIDECTOMY    . TONSILLECTOMY      Current Medications: Current Meds  Medication Sig  . allopurinol (ZYLOPRIM) 300 MG tablet Take 300 mg by mouth daily.  Marland Kitchen atorvastatin (LIPITOR) 20 MG tablet Take 20 mg by mouth at bedtime.  Marland Kitchen esomeprazole (NEXIUM) 40 MG capsule   . losartan (COZAAR) 100 MG tablet Take 100 mg by mouth daily.  . potassium chloride (K-DUR) 10 MEQ tablet TAKE 1 TABLET BY MOUTH DAILY  . sertraline (ZOLOFT) 50 MG tablet Take 1.5 tablets by mouth daily.  Marland Kitchen torsemide (DEMADEX) 20 MG tablet Take 1.5 tablets by mouth daily.  . [DISCONTINUED] carvedilol (COREG) 3.125 MG tablet TAKE 1 TABLET(3.125 MG) BY MOUTH TWICE DAILY     Allergies:   Penicillins   Social History   Socioeconomic History  . Marital status: Married    Spouse name: Not on file  . Number of children: 0  . Years of education: Not on file  . Highest education level: Not on file  Occupational History  . Not on file  Tobacco Use  . Smoking status: Never Smoker  . Smokeless tobacco: Never Used  Vaping Use  . Vaping Use: Never used  Substance and Sexual Activity  . Alcohol use: No    Alcohol/week: 0.0 standard drinks  . Drug use: No  . Sexual activity: Not Currently    Partners: Male  Other Topics Concern  . Not on file  Social History Narrative  . Not on file   Social Determinants of Health   Financial Resource Strain:   . Difficulty of Paying Living Expenses: Not on file  Food Insecurity:   . Worried About Charity fundraiser in the Last Year: Not on file  . Ran Out of Food in the Last Year: Not on file  Transportation Needs:   . Lack of Transportation (Medical): Not on file  . Lack of Transportation (Non-Medical): Not on file  Physical Activity:   . Days of Exercise per Week: Not on file  . Minutes of Exercise per Session: Not on file  Stress:   . Feeling of Stress : Not on file  Social Connections:   . Frequency of Communication with Friends and Family: Not on file  . Frequency  of Social Gatherings with Friends and Family: Not on file  . Attends Religious Services: Not on file  . Active Member of Clubs or Organizations: Not on file  . Attends Archivist Meetings: Not on file  . Marital Status: Not on file     Family History: The patient's family history includes Colon cancer (age of onset: 29) in her mother; Heart attack in her father, maternal grandfather, and paternal grandfather; Hypertension in her father; Kidney cancer in her maternal uncle; Leukemia (age of onset: 60) in her father; Ovarian cancer in her maternal aunt; Ovarian cancer (  age of onset: 23) in her maternal grandmother. There is no history of Breast cancer.  ROS:   Review of Systems  Constitution: Negative for decreased appetite, fever and weight gain.  HENT: Negative for congestion, ear discharge, hoarse voice and sore throat.   Eyes: Negative for discharge, redness, vision loss in right eye and visual halos.  Cardiovascular: Negative for chest pain, dyspnea on exertion, leg swelling, orthopnea and palpitations.  Respiratory: Negative for cough, hemoptysis, shortness of breath and snoring.   Endocrine: Negative for heat intolerance and polyphagia.  Hematologic/Lymphatic: Negative for bleeding problem. Does not bruise/bleed easily.  Skin: Negative for flushing, nail changes, rash and suspicious lesions.  Musculoskeletal: Negative for arthritis, joint pain, muscle cramps, myalgias, neck pain and stiffness.  Gastrointestinal: Negative for abdominal pain, bowel incontinence, diarrhea and excessive appetite.  Genitourinary: Negative for decreased libido, genital sores and incomplete emptying.  Neurological: Negative for brief paralysis, focal weakness, headaches and loss of balance.  Psychiatric/Behavioral: Negative for altered mental status, depression and suicidal ideas.  Allergic/Immunologic: Negative for HIV exposure and persistent infections.    EKGs/Labs/Other Studies Reviewed:     The following studies were reviewed today:   EKG: None today  Zio monitor  The patient wore the monitor for 5 days 23 hours starting June 06, 2020. Indication: Palpitations  The minimum heart rate was 54 bpm, maximum heart rate was 203 bpm, and average heart rate was 73 bpm. Predominant underlying rhythm was Sinus Rhythm.  1 run of Ventricular Tachycardia occurred lasting 18 beats with a maximum rate of 203 bpm (average 177 bpm).  Premature atrial complexes were rare less than 1%. Premature Ventricular complexes rare less than 1%.  No pauses, No AV block, no supraventricular tachycardia and no atrial fibrillation present. 2 patient triggered events into diary events associated with sinus rhythm.   Conclusion: This study is remarkable for the following:  Nonsustained ventricular tachycardia.  Pharmacologic stress test  The left ventricular ejection fraction is normal (55-65%).  Nuclear stress EF: 56%.  The study is normal.  This is a low risk study.   Recent Labs: 06/06/2020: ALT 95; BUN 18; Creatinine, Ser 0.95; Magnesium 2.7; Potassium 4.2; Sodium 141; TSH 2.510  Recent Lipid Panel No results found for: CHOL, TRIG, HDL, CHOLHDL, VLDL, LDLCALC, LDLDIRECT  Physical Exam:    VS:  BP 128/78   Pulse 66   Ht 5\' 3"  (1.6 m)   Wt 197 lb 6.4 oz (89.5 kg)   LMP 03/09/2015 (Within Months)   SpO2 98%   BMI 34.97 kg/m     Wt Readings from Last 3 Encounters:  06/29/20 197 lb 6.4 oz (89.5 kg)  06/21/20 197 lb (89.4 kg)  06/06/20 197 lb 9.6 oz (89.6 kg)     GEN: Well nourished, well developed in no acute distress HEENT: Normal NECK: No JVD; No carotid bruits LYMPHATICS: No lymphadenopathy CARDIAC: S1S2 noted,RRR, no murmurs, rubs, gallops RESPIRATORY:  Clear to auscultation without rales, wheezing or rhonchi  ABDOMEN: Soft, non-tender, non-distended, +bowel sounds, no guarding. EXTREMITIES: No edema, No cyanosis, no clubbing MUSCULOSKELETAL:  No deformity   SKIN: Warm and dry NEUROLOGIC:  Alert and oriented x 3, non-focal PSYCHIATRIC:  Normal affect, good insight  ASSESSMENT:    1. NSVT (nonsustained ventricular tachycardia) ([place])   2. Essential hypertension   3. Mixed hyperlipidemia   4. Fatigue, unspecified type    PLAN:     Her symptoms are improved, but wit her evidence of NSVT on her monitor I like  to increase her beta-blocker dose to 6.25 mg twice daily.  She has had recently a pharmacologic nuclear stress test which showed no ischemia. Follow-up will be done today to assess her LFTs.  If this is back to baseline we will restart her atorvastatin.  If not she should be referred to GI and also get subsequent ultrasound of the liver. 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  The patient is in agreement with the above plan. The patient left the office in stable condition.  The patient will follow up in 1 month due to medication change   Medication Adjustments/Labs and Tests Ordered: Current medicines are reviewed at length with the patient today.  Concerns regarding medicines are outlined above.  No orders of the defined types were placed in this encounter.  Meds ordered this encounter  Medications  . carvedilol (COREG) 6.25 MG tablet    Sig: Take 1 tablet (6.25 mg total) by mouth 2 (two) times daily.    Dispense:  180 tablet    Refill:  3    Patient Instructions  Medication Instructions:  Your physician has recommended you make the following change in your medication:  1.  INCREASE the Carvedilol to 6.25 taking 1 tablet twice a day    *If you need a refill on your cardiac medications before your next appointment, please call your pharmacy*   Lab Work: TODAY:  LFT  Your physician recommends that you continue on your current medications as directed. Please refer to the Current Medication list  given to you today.  If you have labs (blood work) drawn today and your tests are completely normal, you will receive your results only by: Marland Kitchen MyChart Message (if you have MyChart) OR . A paper copy in the mail If you have any lab test that is abnormal or we need to change your treatment, we will call you to review the results.   Testing/Procedures: None ordered   Follow-Up: At [facility], you and your health needs are our priority.  As part of our continuing mission to provide you with exceptional heart care, we have created designated Provider Care Teams.  These Care Teams include your primary Cardiologist (physician) and Advanced Practice Providers (APPs -  Physician Assistants and Nurse Practitioners) who all work together to provide you with the care you need, when you need it.  We recommend signing up for the patient portal called "MyChart".  Sign up information is provided on this After Visit Summary.  MyChart is used to connect with patients for Virtual Visits (Telemedicine).  Patients are able to view lab/test results, encounter notes, upcoming appointments, etc.  Non-urgent messages can be sent to your provider as well.   To learn more about what you can do with MyChart, go to [email or web address].    Your next appointment:   1 month(s)  The format for your next appointment:   In Person  Provider:   Berniece Salines, DO   Other Instructions      Adopting a Healthy Lifestyle.  Know what a healthy weight is for you (roughly BMI <25) and aim to maintain this   Aim for 7+ servings of fruits and vegetables daily   65-80+ fluid ounces of water or unsweet tea for healthy kidneys   Limit to max 1 drink of alcohol per day; avoid smoking/tobacco   Limit animal  fats in diet for cholesterol and heart health - choose grass fed whenever available   Avoid highly processed foods, and foods high in saturated/trans fats   Aim for low stress - take time to unwind and care for  your mental health   Aim for 150 min of moderate intensity exercise weekly for heart health, and weights twice weekly for bone health   Aim for 7-9 hours of sleep daily   When it comes to diets, agreement about the perfect plan isnt easy to find, even among the experts. Experts at the [employer] developed an idea known as the Healthy Eating Plate. Just imagine a plate divided into logical, healthy portions.   The emphasis is on diet quality:   Load up on vegetables and fruits - one-half of your plate: Aim for color and variety, and remember that potatoes dont count.   Go for whole grains - one-quarter of your plate: Whole wheat, barley, wheat berries, quinoa, oats, brown rice, and foods made with them. If you want pasta, go with whole wheat pasta.   Protein power - one-quarter of your plate: Fish, chicken, beans, and nuts are all healthy, versatile protein sources. Limit red meat.   The diet, however, does go beyond the plate, offering a few other suggestions.   Use healthy plant oils, such as olive, canola, soy, corn, sunflower and peanut. Check the labels, and avoid partially hydrogenated oil, which have unhealthy trans fats.   If youre thirsty, drink water. Coffee and tea are good in moderation, but skip sugary drinks and limit milk and dairy products to one or two daily servings.   The type of carbohydrate in the diet is more important than the amount. Some sources of carbohydrates, such as vegetables, fruits, whole grains, and beans-are healthier than others.   Finally, stay active  Signed, Berniece Salines, DO  06/29/2020 10:35 AM    [place]

## 2020-06-29 NOTE — Patient Instructions (Addendum)
Medication Instructions:  Your physician has recommended you make the following change in your medication:  1.  INCREASE the Carvedilol to 6.25 taking 1 tablet twice a day    *If you need a refill on your cardiac medications before your next appointment, please call your pharmacy*   Lab Work: TODAY:  LFT  Your physician recommends that you continue on your current medications as directed. Please refer to the Current Medication list given to you today.  If you have labs (blood work) drawn today and your tests are completely normal, you will receive your results only by: Marland Kitchen MyChart Message (if you have MyChart) OR . A paper copy in the mail If you have any lab test that is abnormal or we need to change your treatment, we will call you to review the results.   Testing/Procedures: None ordered   Follow-Up: At [facility], you and your health needs are our priority.  As part of our continuing mission to provide you with exceptional heart care, we have created designated Provider Care Teams.  These Care Teams include your primary Cardiologist (physician) and Advanced Practice Providers (APPs -  Physician Assistants and Nurse Practitioners) who all work together to provide you with the care you need, when you need it.  We recommend signing up for the patient portal called "MyChart".  Sign up information is provided on this After Visit Summary.  MyChart is used to connect with patients for Virtual Visits (Telemedicine).  Patients are able to view lab/test results, encounter notes, upcoming appointments, etc.  Non-urgent messages can be sent to your provider as well.   To learn more about what you can do with MyChart, go to [email or web address].    Your next appointment:   1 month(s)  The format for your next appointment:   In Person  Provider:   Berniece Salines, DO   Other Instructions

## 2020-07-01 ENCOUNTER — Ambulatory Visit: Payer: BC Managed Care – PPO | Admitting: Cardiology

## 2020-07-04 ENCOUNTER — Telehealth: Payer: Self-pay

## 2020-07-04 NOTE — Telephone Encounter (Signed)
-----   Message from Berniece Salines, DO sent at 06/30/2020  8:52 AM EDT ----- LFTs is still elevated, let the patient know that she can continue to hold her statin for now.  She will need a liver ultrasound.  Let her know that I will forward the information to her PCP.  Please speak with Dr. Bea Graff' s nurse about results and that the patient needs liver US.

## 2020-07-04 NOTE — Telephone Encounter (Signed)
Left message on patients voicemail to please return our call.   

## 2020-07-29 ENCOUNTER — Ambulatory Visit: Payer: BC Managed Care – PPO | Admitting: Cardiology

## 2020-08-10 ENCOUNTER — Other Ambulatory Visit: Payer: Self-pay | Admitting: Obstetrics and Gynecology

## 2020-08-10 DIAGNOSIS — Z1231 Encounter for screening mammogram for malignant neoplasm of breast: Secondary | ICD-10-CM

## 2020-09-14 ENCOUNTER — Other Ambulatory Visit: Payer: Self-pay

## 2020-09-14 DIAGNOSIS — I10 Essential (primary) hypertension: Secondary | ICD-10-CM | POA: Insufficient documentation

## 2020-09-14 DIAGNOSIS — E785 Hyperlipidemia, unspecified: Secondary | ICD-10-CM | POA: Insufficient documentation

## 2020-09-14 DIAGNOSIS — K219 Gastro-esophageal reflux disease without esophagitis: Secondary | ICD-10-CM | POA: Insufficient documentation

## 2020-09-14 DIAGNOSIS — Z87442 Personal history of urinary calculi: Secondary | ICD-10-CM | POA: Insufficient documentation

## 2020-09-14 DIAGNOSIS — F419 Anxiety disorder, unspecified: Secondary | ICD-10-CM | POA: Insufficient documentation

## 2020-09-14 DIAGNOSIS — E079 Disorder of thyroid, unspecified: Secondary | ICD-10-CM | POA: Insufficient documentation

## 2020-09-14 DIAGNOSIS — R87619 Unspecified abnormal cytological findings in specimens from cervix uteri: Secondary | ICD-10-CM | POA: Insufficient documentation

## 2020-09-16 ENCOUNTER — Ambulatory Visit: Payer: BC Managed Care – PPO | Admitting: Cardiology

## 2020-09-16 ENCOUNTER — Other Ambulatory Visit: Payer: Self-pay

## 2020-09-16 ENCOUNTER — Encounter: Payer: Self-pay | Admitting: Cardiology

## 2020-09-16 VITALS — BP 140/78 | HR 62 | Ht 63.0 in | Wt 195.0 lb

## 2020-09-16 DIAGNOSIS — I10 Essential (primary) hypertension: Secondary | ICD-10-CM | POA: Diagnosis not present

## 2020-09-16 DIAGNOSIS — E782 Mixed hyperlipidemia: Secondary | ICD-10-CM | POA: Diagnosis not present

## 2020-09-16 DIAGNOSIS — I359 Nonrheumatic aortic valve disorder, unspecified: Secondary | ICD-10-CM | POA: Diagnosis not present

## 2020-09-16 DIAGNOSIS — I472 Ventricular tachycardia: Secondary | ICD-10-CM | POA: Diagnosis not present

## 2020-09-16 DIAGNOSIS — I4729 Other ventricular tachycardia: Secondary | ICD-10-CM

## 2020-09-16 NOTE — Patient Instructions (Signed)
Medication Instructions:  Your physician recommends that you continue on your current medications as directed. Please refer to the Current Medication list given to you today.  *If you need a refill on your cardiac medications before your next appointment, please call your pharmacy*   Lab Work: Your physician recommends that you return for lab work in: [place], LFT If you have labs (blood work) drawn today and your tests are completely normal, you will receive your results only by: Marland Kitchen MyChart Message (if you have MyChart) OR . A paper copy in the mail If you have any lab test that is abnormal or we need to change your treatment, we will call you to review the results.   Testing/Procedures: None   Follow-Up: At [facility], you and your health needs are our priority.  As part of our continuing mission to provide you with exceptional heart care, we have created designated Provider Care Teams.  These Care Teams include your primary Cardiologist (physician) and Advanced Practice Providers (APPs -  Physician Assistants and Nurse Practitioners) who all work together to provide you with the care you need, when you need it.  We recommend signing up for the patient portal called "MyChart".  Sign up information is provided on this After Visit Summary.  MyChart is used to connect with patients for Virtual Visits (Telemedicine).  Patients are able to view lab/test results, encounter notes, upcoming appointments, etc.  Non-urgent messages can be sent to your provider as well.   To learn more about what you can do with MyChart, go to [email or web address].    Your next appointment:   6 month(s)  The format for your next appointment:   In Person  Provider:   Berniece Salines, DO   Other Instructions

## 2020-09-16 NOTE — Progress Notes (Signed)
Cardiology Office Note:    Date:  09/16/2020   ID:  Erin Andrade, DOB 11/26/1960, MRN [id number]  PCP:  Raina Mina., MD  Cardiologist:  Berniece Salines, DO  Electrophysiologist:  None   Referring MD: Raina Mina., MD   Chief Complaint  Patient presents with  . Follow-up    History of Present Illness:    Erin Andrade is a 59 y.o. female with a hx of hypertension, anxiety, hyperlipidemia is here today for follow-up visit. I saw the patient in September 2021 at that time she did have elevated LFTs and we held her statin.  In addition we discussed and started her on low-dose beta-blocker for her NSVT.  His carvedilol 6.25 twice daily.  She is here today for follow-up visit.    Past Medical History:  Diagnosis Date  . Abnormal Pap smear of cervix 2008?   HPV  . 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 Anxiety   . Aortic valve disorder 05/07/2016  . Bicuspid aortic valve 05/07/2016   Last Assessment & Plan:  Reviewed her last [place] she had which was stable for her  . Chronic idiopathic gout of multiple sites 05/15/2016   Last Assessment & Plan:  Increase this uloric to 80 mg daily for her and she can use the colchicine for flares hopefully rather than try to take daily  . Chronic recurrent major depressive disorder ([place]) 02/17/2016   Last Assessment & Plan:  Relevant Hx: Course: Daily Update: Today's Plan:increase the zoloft for her  Electronically signed by: Mayer Camel, NP 02/20/16 1107  . Colon polyp    at age 22  . Encounter for long-term current use of high risk medication 02/17/2016  . Essential hypertension 02/17/2016   Last Assessment & Plan:  Relevant Hx: Course: Daily Update: Today's Plan:better on recheck for her continue with her current dose of med  Electronically signed by: Mayer Camel, NP 02/20/16 1103  . Family history of colon cancer   . Family history of ovarian cancer   . GERD (gastroesophageal reflux disease)   . Hallux valgus,  acquired 05/04/2016  . History of kidney stones   . Hyperlipidemia   . Hypertension   . Hypokalemia 02/17/2016  . Hypothyroidism (acquired) 02/17/2016   Last Assessment & Plan:  Relevant Hx: Course: Daily Update: Today's Plan:she has been stable in the past continue to follow  Electronically signed by: Mayer Camel, NP 02/20/16 1103  . Kidney stone 05/07/2016  . Localized edema 02/17/2016   Last Assessment & Plan:  Relevant Hx: Course: Daily Update: Today's Plan:is stable with her torsemide but today has increased due to her foot pain and presumed gout  Electronically signed by: Mayer Camel, NP 02/20/16 1107  . Malaise and fatigue 02/17/2016   Last Assessment & Plan:  Relevant Hx: Course: Daily Update: Today's Plan:this is off and on for her and will follow this for her and discussed her mood as she is not sure that the zoloft increase will not help her more and will do this for her  Electronically signed by: Mayer Camel, NP 02/20/16 2055  . Mixed hyperlipidemia 02/17/2016   Last Assessment & Plan:  Relevant Hx: Course: Daily Update: Today's Plan:not taking her med as she should be due to having had increased myalgias and will continue to follow this for her and see where her level is at this time  Electronically signed by: Mayer Camel, NP 02/20/16 1104  . Palpitation 10/13/2019  .  Primary insomnia 02/17/2016   Last Assessment & Plan:  Relevant Hx: Course: Daily Update: Today's Plan:this is stable for her  Electronically signed by: Mayer Camel, NP 02/20/16 1106  . Thyroid disease   . Vitamin D deficiency 02/17/2016    Past Surgical History:  Procedure Laterality Date  . 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 CHOLECYSTECTOMY    . COLPOSCOPY  2008  . PARATHYROIDECTOMY    . TONSILLECTOMY      Current Medications: Current Meds  Medication Sig  . allopurinol (ZYLOPRIM) 300 MG tablet Take 300  mg by mouth daily.  Marland Kitchen atorvastatin (LIPITOR) 20 MG tablet Take 20 mg by mouth at bedtime.  . carvedilol (COREG) 3.125 MG tablet Take 3.125 mg by mouth 2 (two) times daily.  Marland Kitchen esomeprazole (NEXIUM) 40 MG capsule   . losartan (COZAAR) 100 MG tablet Take 100 mg by mouth daily.  . potassium chloride (K-DUR) 10 MEQ tablet TAKE 1 TABLET BY MOUTH DAILY  . sertraline (ZOLOFT) 50 MG tablet Take 1.5 tablets by mouth daily.  Marland Kitchen torsemide (DEMADEX) 20 MG tablet Take 1.5 tablets by mouth daily.     Allergies:   Penicillins   Social History   Socioeconomic History  . Marital status: Married    Spouse name: Not on file  . Number of children: 0  . Years of education: Not on file  . Highest education level: Not on file  Occupational History  . Not on file  Tobacco Use  . Smoking status: Never Smoker  . Smokeless tobacco: Never Used  Vaping Use  . Vaping Use: Never used  Substance and Sexual Activity  . Alcohol use: No    Alcohol/week: 0.0 standard drinks  . Drug use: No  . Sexual activity: Not Currently    Partners: Male  Other Topics Concern  . Not on file  Social History Narrative  . Not on file   Social Determinants of Health   Financial Resource Strain: Not on file  Food Insecurity: Not on file  Transportation Needs: Not on file  Physical Activity: Not on file  Stress: Not on file  Social Connections: Not on file     Family History: The patient's family history includes Colon cancer (age of onset: 65) in her mother; Heart attack in her father, maternal grandfather, and paternal grandfather; Hypertension in her father; Kidney cancer in her maternal uncle; Leukemia (age of onset: 61) in her father; Ovarian cancer in her maternal aunt; Ovarian cancer (age of onset: 5) in her maternal grandmother. There is no history of Breast cancer.  ROS:   Review of Systems  Constitution: Negative for decreased appetite, fever and weight gain.  HENT: Negative for congestion, ear discharge,  hoarse voice and sore throat.   Eyes: Negative for discharge, redness, vision loss in right eye and visual halos.  Cardiovascular: Negative for chest pain, dyspnea on exertion, leg swelling, orthopnea and palpitations.  Respiratory: Negative for cough, hemoptysis, shortness of breath and snoring.   Endocrine: Negative for heat intolerance and polyphagia.  Hematologic/Lymphatic: Negative for bleeding problem. Does not bruise/bleed easily.  Skin: Negative for flushing, nail changes, rash and suspicious lesions.  Musculoskeletal: Negative for arthritis, joint pain, muscle cramps, myalgias, neck pain and stiffness.  Gastrointestinal: Negative for abdominal pain, bowel incontinence, diarrhea and excessive appetite.  Genitourinary: Negative for decreased libido, genital sores and incomplete emptying.  Neurological: Negative for brief paralysis, focal weakness, headaches and  loss of balance.  Psychiatric/Behavioral: Negative for altered mental status, depression and suicidal ideas.  Allergic/Immunologic: Negative for HIV exposure and persistent infections.    EKGs/Labs/Other Studies Reviewed:    The following studies were reviewed today:   EKG:  The ekg ordered today demonstrates   Zio monitor  The patient wore the monitor for 5 days 23 hours starting June 06, 2020. Indication: Palpitations  The minimum heart rate was 54 bpm, maximum heart rate was 203 bpm, and average heart rate was 73 bpm. Predominant underlying rhythm was Sinus Rhythm.  1 run of Ventricular Tachycardia occurred lasting 18 beats with a maximum rate of 203 bpm (average 177 bpm).  Premature atrial complexes were rare less than 1%. Premature Ventricular complexes rare less than 1%.  No pauses, No AV block, no supraventricular tachycardia and no atrial fibrillation present. 2 patient triggered events into diary events associated with sinus rhythm.   Conclusion: This study is remarkable for the following:  Nonsustained ventricular tachycardia.  Pharmacologic stress test  The left ventricular ejection fraction is normal (55-65%).  Nuclear stress EF: 56%.  The study is normal.  This is a low risk study.   Recent Labs: 06/06/2020: BUN 18; Creatinine, Ser 0.95; Magnesium 2.7; Potassium 4.2; Sodium 141; TSH 2.510 06/29/2020: ALT 81  Recent Lipid Panel No results found for: CHOL, TRIG, HDL, CHOLHDL, VLDL, LDLCALC, LDLDIRECT  Physical Exam:    VS:  BP 140/78 (BP Location: Left Arm, Patient Position: Sitting, Cuff Size: Normal)   Pulse 62   Ht 5\' 3"  (1.6 m)   Wt 195 lb (88.5 kg)   LMP 03/09/2015 (Within Months)   SpO2 98%   BMI 34.54 kg/m     Wt Readings from Last 3 Encounters:  09/16/20 195 lb (88.5 kg)  06/29/20 197 lb 6.4 oz (89.5 kg)  06/21/20 197 lb (89.4 kg)     GEN: Well nourished, well developed in no acute distress HEENT: Normal NECK: No JVD; No carotid bruits LYMPHATICS: No lymphadenopathy CARDIAC: S1S2 noted,RRR, no murmurs, rubs, gallops RESPIRATORY:  Clear to auscultation without rales, wheezing or rhonchi  ABDOMEN: Soft, non-tender, non-distended, +bowel sounds, no guarding. EXTREMITIES: No edema, No cyanosis, no clubbing MUSCULOSKELETAL:  No deformity  SKIN: Warm and dry NEUROLOGIC:  Alert and oriented x 3, non-focal PSYCHIATRIC:  Normal affect, good insight  ASSESSMENT:    1. NSVT (nonsustained ventricular tachycardia) ([place])   2. Aortic valve disorder   3. Primary hypertension   4. Mixed hyperlipidemia    PLAN:    She appears to be doing well from a cardiovascular standpoint.  No medication changes will be made today.  Her blood pressure acceptable as well. She has not had blood work recently and we had been monitoring her LFTs him going to get blood work today with her BMP as well as LFTs. The patient understands the need to lose weight with diet and exercise. We have discussed specific strategies for this.   The patient is in agreement with the  above plan. The patient left the office in stable condition.  The patient will follow up in 6 months or sooner if needed.   Medication Adjustments/Labs and Tests Ordered: Current medicines are reviewed at length with the patient today.  Concerns regarding medicines are outlined above.  No orders of the defined types were placed in this encounter.  No orders of the defined types were placed in this encounter.   There are no Patient Instructions on file for this visit.  Adopting a Healthy Lifestyle.  Know what a healthy weight is for you (roughly BMI <25) and aim to maintain this   Aim for 7+ servings of fruits and vegetables daily   65-80+ fluid ounces of water or unsweet tea for healthy kidneys   Limit to max 1 drink of alcohol per day; avoid smoking/tobacco   Limit animal fats in diet for cholesterol and heart health - choose grass fed whenever available   Avoid highly processed foods, and foods high in saturated/trans fats   Aim for low stress - take time to unwind and care for your mental health   Aim for 150 min of moderate intensity exercise weekly for heart health, and weights twice weekly for bone health   Aim for 7-9 hours of sleep daily   When it comes to diets, agreement about the perfect plan isnt easy to find, even among the experts. Experts at the [employer] developed an idea known as the Healthy Eating Plate. Just imagine a plate divided into logical, healthy portions.   The emphasis is on diet quality:   Load up on vegetables and fruits - one-half of your plate: Aim for color and variety, and remember that potatoes dont count.   Go for whole grains - one-quarter of your plate: Whole wheat, barley, wheat berries, quinoa, oats, brown rice, and foods made with them. If you want pasta, go with whole wheat pasta.   Protein power - one-quarter of your plate: Fish, chicken, beans, and nuts are all healthy, versatile protein sources. Limit red  meat.   The diet, however, does go beyond the plate, offering a few other suggestions.   Use healthy plant oils, such as olive, canola, soy, corn, sunflower and peanut. Check the labels, and avoid partially hydrogenated oil, which have unhealthy trans fats.   If youre thirsty, drink water. Coffee and tea are good in moderation, but skip sugary drinks and limit milk and dairy products to one or two daily servings.   The type of carbohydrate in the diet is more important than the amount. Some sources of carbohydrates, such as vegetables, fruits, whole grains, and beans-are healthier than others.   Finally, stay active  Signed, Berniece Salines, DO  09/16/2020 3:47 PM    [facility]

## 2020-09-17 LAB — BASIC METABOLIC PANEL
BUN/Creatinine Ratio: 17 (ref 9–23)
BUN: 16 mg/dL (ref 6–24)
CO2: 23 mmol/L (ref 20–29)
Calcium: 9.3 mg/dL (ref 8.7–10.2)
Chloride: 101 mmol/L (ref 96–106)
Creatinine, Ser: 0.93 mg/dL (ref 0.57–1.00)
GFR calc Af Amer: 78 mL/min/{1.73_m2} (ref >59)
GFR calc non Af Amer: 68 mL/min/{1.73_m2} (ref >59)
Glucose: 155 mg/dL — ABNORMAL HIGH (ref 65–99)
Potassium: 3.9 mmol/L (ref 3.5–5.2)
Sodium: 141 mmol/L (ref 134–144)

## 2020-09-17 LAB — MAGNESIUM: Magnesium: 2.3 mg/dL (ref 1.6–2.3)

## 2020-09-17 LAB — HEPATIC FUNCTION PANEL
ALT: 90 IU/L — ABNORMAL HIGH (ref 0–32)
AST: 80 IU/L — ABNORMAL HIGH (ref 0–40)
Albumin: 4.2 g/dL (ref 3.8–4.9)
Alkaline Phosphatase: 110 IU/L (ref 44–121)
Bilirubin Total: 0.4 mg/dL (ref 0.0–1.2)
Bilirubin, Direct: 0.12 mg/dL (ref 0.00–0.40)
Total Protein: 7.2 g/dL (ref 6.0–8.5)

## 2020-09-19 ENCOUNTER — Telehealth: Payer: Self-pay

## 2020-09-19 NOTE — Telephone Encounter (Signed)
-----   Message from Berniece Salines, DO sent at 09/19/2020 11:06 AM EST ----- Your liver function test is still elevated.  We will continue to monitor.  Please discuss with Dr. Lyda Jester as well if he needs further testing

## 2020-09-19 NOTE — Telephone Encounter (Signed)
Spoke with patient regarding results and recommendation.  Patient verbalizes understanding and is agreeable to plan of care. Advised patient to call back with any issues or concerns.  

## 2020-09-20 ENCOUNTER — Other Ambulatory Visit: Payer: Self-pay

## 2020-09-20 ENCOUNTER — Ambulatory Visit
Admission: RE | Admit: 2020-09-20 | Discharge: 2020-09-20 | Disposition: A | Discharge status: Home / Self Care | Payer: BC Managed Care – PPO | Source: Ambulatory Visit | Attending: Obstetrics and Gynecology | Admitting: Obstetrics and Gynecology

## 2020-09-20 DIAGNOSIS — Z1231 Encounter for screening mammogram for malignant neoplasm of breast: Secondary | ICD-10-CM

## 2020-09-23 ENCOUNTER — Ambulatory Visit: Payer: BC Managed Care – PPO

## 2020-10-13 ENCOUNTER — Other Ambulatory Visit: Payer: Self-pay | Admitting: Cardiology

## 2020-12-14 ENCOUNTER — Ambulatory Visit: Payer: BC Managed Care – PPO | Admitting: Obstetrics and Gynecology

## 2020-12-14 NOTE — Progress Notes (Deleted)
60 y.o. G0P0000 Married {Race/ethnicity:17218} female here for annual exam.    PCP:     Patient's last menstrual period was 03/09/2015 (within months).           Sexually active: {yes no:314532}  The current method of family planning is post menopausal status.    Exercising: {yes no:314532}  {types:19826} Smoker:  no  Health Maintenance: Pap:  12/13/17 Neg:Neg HR HPV History of abnormal Pap:  Yes, 2008 Positive HPV had colposcopy and LEEP per patient MMG:  09/20/20 BIRADS 1 negative/density b Colonoscopy:  *** BMD:   ***  Result  *** TDaP:  06/15/16 Gardasil:   n/a HIV: Hep C: Screening Labs:  Hb today: ***, Urine today: ***   reports that she has never smoked. She has never used smokeless tobacco. She reports that she does not drink alcohol and does not use drugs.  Past Medical History:  Diagnosis Date  . Abnormal Pap smear of cervix 2008?   HPV  . 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 Anxiety   . Aortic valve disorder 05/07/2016  . Bicuspid aortic valve 05/07/2016   Last Assessment & Plan:  Reviewed her last [place] she had which was stable for her  . Chronic idiopathic gout of multiple sites 05/15/2016   Last Assessment & Plan:  Increase this uloric to 80 mg daily for her and she can use the colchicine for flares hopefully rather than try to take daily  . Chronic recurrent major depressive disorder ([place]) 02/17/2016   Last Assessment & Plan:  Relevant Hx: Course: Daily Update: Today's Plan:increase the zoloft for her  Electronically signed by: Mayer Camel, NP 02/20/16 1107  . Colon polyp    at age 61  . Encounter for long-term current use of high risk medication 02/17/2016  . Essential hypertension 02/17/2016   Last Assessment & Plan:  Relevant Hx: Course: Daily Update: Today's Plan:better on recheck for her continue with her current dose of med  Electronically signed by: Mayer Camel, NP 02/20/16 1103  . Family history of colon cancer   . Family history of  ovarian cancer   . GERD (gastroesophageal reflux disease)   . Hallux valgus, acquired 05/04/2016  . History of kidney stones   . Hyperlipidemia   . Hypertension   . Hypokalemia 02/17/2016  . Hypothyroidism (acquired) 02/17/2016   Last Assessment & Plan:  Relevant Hx: Course: Daily Update: Today's Plan:she has been stable in the past continue to follow  Electronically signed by: Mayer Camel, NP 02/20/16 1103  . Kidney stone 05/07/2016  . Localized edema 02/17/2016   Last Assessment & Plan:  Relevant Hx: Course: Daily Update: Today's Plan:is stable with her torsemide but today has increased due to her foot pain and presumed gout  Electronically signed by: Mayer Camel, NP 02/20/16 1107  . Malaise and fatigue 02/17/2016   Last Assessment & Plan:  Relevant Hx: Course: Daily Update: Today's Plan:this is off and on for her and will follow this for her and discussed her mood as she is not sure that the zoloft increase will not help her more and will do this for her  Electronically signed by: Mayer Camel, NP 02/20/16 2055  . Mixed hyperlipidemia 02/17/2016   Last Assessment & Plan:  Relevant Hx: Course: Daily Update: Today's Plan:not taking her med as she should be due to having had increased myalgias and will continue to follow this for her and see where her level is at this time  Electronically signed by: Mayer Camel, NP 02/20/16 1104  . Palpitation 10/13/2019  . Primary insomnia 02/17/2016   Last Assessment & Plan:  Relevant Hx: Course: Daily Update: Today's Plan:this is stable for her  Electronically signed by: Mayer Camel, NP 02/20/16 1106  . Thyroid disease   . Vitamin D deficiency 02/17/2016    Past Surgical History:  Procedure Laterality Date  . 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 CHOLECYSTECTOMY    . COLPOSCOPY  2008  . PARATHYROIDECTOMY    . TONSILLECTOMY      Current Outpatient  Medications  Medication Sig Dispense Refill  . allopurinol (ZYLOPRIM) 300 MG tablet Take 300 mg by mouth daily.    Marland Kitchen atorvastatin (LIPITOR) 20 MG tablet Take 20 mg by mouth at bedtime.    . carvedilol (COREG) 3.125 MG tablet TAKE 1 TABLET(3.125 MG) BY MOUTH TWICE DAILY 180 tablet 2  . esomeprazole (NEXIUM) 40 MG capsule   0  . losartan (COZAAR) 100 MG tablet Take 100 mg by mouth daily.    . potassium chloride (K-DUR) 10 MEQ tablet TAKE 1 TABLET BY MOUTH DAILY    . sertraline (ZOLOFT) 50 MG tablet Take 1.5 tablets by mouth daily.    Marland Kitchen torsemide (DEMADEX) 20 MG tablet Take 1.5 tablets by mouth daily.     No current facility-administered medications for this visit.    Family History  Problem Relation Age of Onset  . Colon cancer Mother 53  . Leukemia Father 68  . Hypertension Father   . Heart attack Father   . Ovarian cancer Maternal Aunt        dx in her late 43s  . Ovarian cancer Maternal Grandmother 43  . Kidney cancer Maternal Uncle        died in early 61s  . Heart attack Maternal Grandfather   . Heart attack Paternal Grandfather   . Breast cancer Neg Hx     Review of Systems  Exam:   LMP 03/09/2015 (Within Months)     General appearance: alert, cooperative and appears stated age Head: normocephalic, without obvious abnormality, atraumatic Neck: no adenopathy, supple, symmetrical, trachea midline and thyroid normal to inspection and palpation Lungs: clear to auscultation bilaterally Breasts: normal appearance, no masses or tenderness, No nipple retraction or dimpling, No nipple discharge or bleeding, No axillary adenopathy Heart: regular rate and rhythm Abdomen: soft, non-tender; no masses, no organomegaly Extremities: extremities normal, atraumatic, no cyanosis or edema Skin: skin color, texture, turgor normal. No rashes or lesions Lymph nodes: cervical, supraclavicular, and axillary nodes normal. Neurologic: grossly normal  Pelvic: External genitalia:  no lesions               No abnormal inguinal nodes palpated.              Urethra:  normal appearing urethra with no masses, tenderness or lesions              Bartholins and Skenes: normal                 Vagina: normal appearing vagina with normal color and discharge, no lesions              Cervix: no lesions              Pap taken: {yes no:314532} Bimanual Exam:  Uterus:  normal size, contour, position, consistency, mobility, non-tender  Adnexa: no mass, fullness, tenderness              Rectal exam: {yes no:314532}.  Confirms.              Anus:  normal sphincter tone, no lesions  Chaperone was present for exam.  Assessment:   Well woman visit with normal exam.   Plan: Mammogram screening discussed. Self breast awareness reviewed. Pap and HR HPV as above. Guidelines for Calcium, Vitamin D, regular exercise program including cardiovascular and weight bearing exercise.   Follow up annually and prn.   Additional counseling given.  {yes [id number]. _______ minutes face to face time of which over 50% was spent in counseling.    After visit summary provided.

## 2021-03-09 ENCOUNTER — Ambulatory Visit: Payer: BC Managed Care – PPO | Admitting: Cardiology

## 2021-03-09 ENCOUNTER — Other Ambulatory Visit: Payer: Self-pay

## 2021-03-09 ENCOUNTER — Encounter: Payer: Self-pay | Admitting: Cardiology

## 2021-03-09 VITALS — BP 130/76 | HR 62 | Ht 63.0 in | Wt 189.0 lb

## 2021-03-09 DIAGNOSIS — E782 Mixed hyperlipidemia: Secondary | ICD-10-CM | POA: Diagnosis not present

## 2021-03-09 DIAGNOSIS — I472 Ventricular tachycardia: Secondary | ICD-10-CM | POA: Diagnosis not present

## 2021-03-09 DIAGNOSIS — I10 Essential (primary) hypertension: Secondary | ICD-10-CM

## 2021-03-09 DIAGNOSIS — Z79899 Other long term (current) drug therapy: Secondary | ICD-10-CM | POA: Diagnosis not present

## 2021-03-09 DIAGNOSIS — I4729 Other ventricular tachycardia: Secondary | ICD-10-CM

## 2021-03-09 DIAGNOSIS — I351 Nonrheumatic aortic (valve) insufficiency: Secondary | ICD-10-CM

## 2021-03-09 MED ORDER — PRAVASTATIN SODIUM 40 MG PO TABS: 40.0000 mg | ORAL_TABLET | Freq: Every evening | ORAL | 3 refills | Status: DC

## 2021-03-09 NOTE — Progress Notes (Signed)
Cardiology Office Note:    Date:  03/09/2021   ID:  Erin Andrade, DOB [birth year], MRN [id number]  PCP:  Erin Andrade., MD  Cardiologist:  Erin Salines, DO  Electrophysiologist:  None   Referring MD: Erin Andrade., MD   Chief Complaint  Patient presents with  . Follow-up    History of Present Illness:    Erin Andrade is a 60 y.o. female with a hx of hyperlipidemia, NSVT on low-dose beta-blocker carvedilol 6.25 mg twice daily he is here today for follow-up visit.  Last saw the patient December 2021 at that time she appeared to be doing well from a cardiovascular standpoint. Today she tells me that she believes that the Lipitor is making her muscle aches and pains worse.  He denies any chest pain, shortness of breath.   Past Medical History:  Diagnosis Date  . Abnormal Pap smear of cervix 2008?   HPV  . 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 Anxiety   . Aortic valve disorder 05/07/2016  . Bicuspid aortic valve 05/07/2016   Last Assessment & Plan:  Reviewed her last [place] she had which was stable for her  . Chronic idiopathic gout of multiple sites 05/15/2016   Last Assessment & Plan:  Increase this uloric to 80 mg daily for her and she can use the colchicine for flares hopefully rather than try to take daily  . Chronic recurrent major depressive disorder ([place]) 02/17/2016   Last Assessment & Plan:  Relevant Hx: Course: Daily Update: Today's Plan:increase the zoloft for her  Electronically signed by: Mayer Camel, NP 02/20/16 1107  . Colon polyp    at age 33  . Encounter for long-term current use of high risk medication 02/17/2016  . Essential hypertension 02/17/2016   Last Assessment & Plan:  Relevant Hx: Course: Daily Update: Today's Plan:better on recheck for her continue with her current dose of med  Electronically signed by: Mayer Camel, NP 02/20/16 1103  . Family history of colon cancer   . Family history of ovarian cancer   . GERD  (gastroesophageal reflux disease)   . Hallux valgus, acquired 05/04/2016  . History of kidney stones   . Hyperlipidemia   . Hypertension   . Hypokalemia 02/17/2016  . Hypothyroidism (acquired) 02/17/2016   Last Assessment & Plan:  Relevant Hx: Course: Daily Update: Today's Plan:she has been stable in the past continue to follow  Electronically signed by: Mayer Camel, NP 02/20/16 1103  . Kidney stone 05/07/2016  . Localized edema 02/17/2016   Last Assessment & Plan:  Relevant Hx: Course: Daily Update: Today's Plan:is stable with her torsemide but today has increased due to her foot pain and presumed gout  Electronically signed by: Mayer Camel, NP 02/20/16 1107  . Malaise and fatigue 02/17/2016   Last Assessment & Plan:  Relevant Hx: Course: Daily Update: Today's Plan:this is off and on for her and will follow this for her and discussed her mood as she is not sure that the zoloft increase will not help her more and will do this for her  Electronically signed by: Mayer Camel, NP 02/20/16 2055  . Mixed hyperlipidemia 02/17/2016   Last Assessment & Plan:  Relevant Hx: Course: Daily Update: Today's Plan:not taking her med as she should be due to having had increased myalgias and will continue to follow this for her and see where her level is at this time  Electronically signed by: Mayer Camel, NP  02/20/16 1104  . Palpitation 10/13/2019  . Primary insomnia 02/17/2016   Last Assessment & Plan:  Relevant Hx: Course: Daily Update: Today's Plan:this is stable for her  Electronically signed by: Mayer Camel, NP 02/20/16 1106  . Thyroid disease   . Vitamin D deficiency 02/17/2016    Past Surgical History:  Procedure Laterality Date  . CERVICAL BIOPSY  W/ LOOP ELECTRODE EXCISION  2007   CIN II - Dr. Quincy Andrade  . CHOLECYSTECTOMY    . COLPOSCOPY  2008  . PARATHYROIDECTOMY    . TONSILLECTOMY      Current Medications: Current Meds  Medication  Sig  . carvedilol (COREG) 6.25 MG tablet Take 6.25 mg by mouth 2 (two) times daily.  Marland Kitchen esomeprazole (NEXIUM) 40 MG capsule   . losartan (COZAAR) 100 MG tablet Take 100 mg by mouth daily.  . pravastatin (PRAVACHOL) 40 MG tablet Take 1 tablet (40 mg total) by mouth every evening.  . sertraline (ZOLOFT) 50 MG tablet Take 1.5 tablets by mouth daily.  Marland Kitchen torsemide (DEMADEX) 20 MG tablet Take 1.5 tablets by mouth daily.  . [DISCONTINUED] atorvastatin (LIPITOR) 20 MG tablet Take 20 mg by mouth at bedtime.     Allergies:   Lipitor [atorvastatin] and Penicillins   Social History   Socioeconomic History  . Marital status: Married    Spouse name: Not on file  . Number of children: 0  . Years of education: Not on file  . Highest education level: Not on file  Occupational History  . Not on file  Tobacco Use  . Smoking status: Never Smoker  . Smokeless tobacco: Never Used  Vaping Use  . Vaping Use: Never used  Substance and Sexual Activity  . Alcohol use: No    Alcohol/week: 0.0 standard drinks  . Drug use: No  . Sexual activity: Not Currently    Partners: Male  Other Topics Concern  . Not on file  Social History Narrative  . Not on file   Social Determinants of Health   Financial Resource Strain: Not on file  Food Insecurity: Not on file  Transportation Needs: Not on file  Physical Activity: Not on file  Stress: Not on file  Social Connections: Not on file     Family History: The patient's family history includes Colon cancer (age of onset: 54) in her mother; Heart attack in her father, maternal grandfather, and paternal grandfather; Hypertension in her father; Kidney cancer in her maternal uncle; Leukemia (age of onset: 69) in her father; Ovarian cancer in her maternal aunt; Ovarian cancer (age of onset: 51) in her maternal grandmother. There is no history of Breast cancer.  ROS:   Review of Systems  Constitution: Negative for decreased appetite, fever and weight gain.  HENT:  Negative for congestion, ear discharge, hoarse voice and sore throat.   Eyes: Negative for discharge, redness, vision loss in right eye and visual halos.  Cardiovascular: Negative for chest pain, dyspnea on exertion, leg swelling, orthopnea and palpitations.  Respiratory: Negative for cough, hemoptysis, shortness of breath and snoring.   Endocrine: Negative for heat intolerance and polyphagia.  Hematologic/Lymphatic: Negative for bleeding problem. Does not bruise/bleed easily.  Skin: Negative for flushing, nail changes, rash and suspicious lesions.  Musculoskeletal: Negative for arthritis, joint pain, muscle cramps, myalgias, neck pain and stiffness.  Gastrointestinal: Negative for abdominal pain, bowel incontinence, diarrhea and excessive appetite.  Genitourinary: Negative for decreased libido, genital sores and incomplete emptying.  Neurological: Negative for brief paralysis, focal weakness,  headaches and loss of balance.  Psychiatric/Behavioral: Negative for altered mental status, depression and suicidal ideas.  Allergic/Immunologic: Negative for HIV exposure and persistent infections.    EKGs/Labs/Other Studies Reviewed:    The following studies were reviewed today:   EKG:  The ekg ordered today demonstrates   Zio monitorSeptember 2021 The patient wore the monitor for 5 days 23 hours starting June 06, 2020. Indication: Palpitations  The minimum heart rate was 54 bpm, maximum heart rate was 203 bpm, and average heart rate was 73 bpm. Predominant underlying rhythm was Sinus Rhythm.  1 run of Ventricular Tachycardia occurred lasting 18 beats with a maximum rate of 203 bpm (average 177 bpm).  Premature atrial complexes were rare less than 1%. Premature Ventricular complexes rare less than 1%.  No pauses, No AV block, no supraventricular tachycardia and no atrial fibrillation present. 2 patient triggered events into diary events associated with sinus rhythm.   Conclusion:  This study is remarkable for the following: Nonsustained ventricular tachycardia.  Pharmacologic stress test September 2021  The left ventricular ejection fraction is normal (55-65%).  Nuclear stress EF: 56%.  The study is normal.  This is a low risk study.  March 2021 IMPRESSIONS    1. Left ventricular ejection fraction, by estimation, is 60 to 65%. The  left ventricle has normal function. The left ventricle has no regional  wall motion abnormalities. There is mild left ventricular hypertrophy.  Left ventricular diastolic parameters  were normal.  2. Right ventricular systolic function is normal. The right ventricular  size is normal. There is normal pulmonary artery systolic pressure.  3. The mitral valve is normal in structure. No evidence of mitral valve  regurgitation. No evidence of mitral stenosis.  4. The aortic valve is normal in structure. Aortic valve regurgitation is  mild. No aortic stenosis is present.  5. The inferior vena cava is normal in size with greater than 50%  respiratory variability, suggesting right atrial pressure of 3 mmHg.   Recent Labs: 06/06/2020: TSH 2.510 09/16/2020: ALT 90; BUN 16; Creatinine, Ser 0.93; Magnesium 2.3; Potassium 3.9; Sodium 141  Recent Lipid Panel No results found for: CHOL, TRIG, HDL, CHOLHDL, VLDL, LDLCALC, LDLDIRECT  Physical Exam:    VS:  BP 130/76 (BP Location: Right Arm, Patient Position: Sitting, Cuff Size: Normal)   Pulse 62   Ht 5\' 3"  (1.6 m)   Wt 189 lb (85.7 kg)   LMP 03/09/2015 (Within Months)   SpO2 97%   BMI 33.48 kg/m     Wt Readings from Last 3 Encounters:  03/09/21 189 lb (85.7 kg)  09/16/20 195 lb (88.5 kg)  06/29/20 197 lb 6.4 oz (89.5 kg)     GEN: Well nourished, well developed in no acute distress HEENT: Normal NECK: No JVD; No carotid bruits LYMPHATICS: No lymphadenopathy CARDIAC: S1S2 noted,RRR, no murmurs, rubs, gallops RESPIRATORY:  Clear to auscultation without rales, wheezing or  rhonchi  ABDOMEN: Soft, non-tender, non-distended, +bowel sounds, no guarding. EXTREMITIES: No edema, No cyanosis, no clubbing MUSCULOSKELETAL:  No deformity  SKIN: Warm and dry NEUROLOGIC:  Alert and oriented x 3, non-focal PSYCHIATRIC:  Normal affect, good insight  ASSESSMENT:    1. Essential hypertension   2. Medication management   3. NSVT (nonsustained ventricular tachycardia) ([place])   4. Mixed hyperlipidemia   5. Mild aortic regurgitation    PLAN:     1.  He is experiencing worsening muscle aches and pains with the Lipitor.  We will stop this and start  the patient on pravastatin 40 mg daily.  She will continue her coenzyme every 10.  We will monitor the patient on the pravastatin if this medication is intolerable we will make recommendation as appropriate.  She has blood work coming up with her PCP as requested to get the results of her lipid profile.  Continue beta-blocker in the setting of NSVT. No angina symptoms  The patient is in agreement with the above plan. The patient left the office in stable condition.  The patient will follow up in 1 year or sooner if needed.   Medication Adjustments/Labs and Tests Ordered: Current medicines are reviewed at length with the patient today.  Concerns regarding medicines are outlined above.  Orders Placed This Encounter  Procedures  . Basic metabolic panel  . Magnesium   Meds ordered this encounter  Medications  . pravastatin (PRAVACHOL) 40 MG tablet    Sig: Take 1 tablet (40 mg total) by mouth every evening.    Dispense:  90 tablet    Refill:  3    Patient Instructions  Medication Instructions:  Your physician has recommended you make the following change in your medication: STOP: Lipitor START: Pravastatin 40 mg once daily *If you need a refill on your cardiac medications before your next appointment, please call your pharmacy*   Lab Work: Your physician recommends that you return for lab work: TODAY: BMET, [place] If  you have labs (blood work) drawn today and your tests are completely normal, you will receive your results only by: Marland Kitchen MyChart Message (if you have MyChart) OR . A paper copy in the mail If you have any lab test that is abnormal or we need to change your treatment, we will call you to review the results.   Testing/Procedures: None   Follow-Up: At [facility], you and your health needs are our priority.  As part of our continuing mission to provide you with exceptional heart care, we have created designated Provider Care Teams.  These Care Teams include your primary Cardiologist (physician) and Advanced Practice Providers (APPs -  Physician Assistants and Nurse Practitioners) who all work together to provide you with the care you need, when you need it.  We recommend signing up for the patient portal called "MyChart".  Sign up information is provided on this After Visit Summary.  MyChart is used to connect with patients for Virtual Visits (Telemedicine).  Patients are able to view lab/test results, encounter notes, upcoming appointments, etc.  Non-urgent messages can be sent to your provider as well.   To learn more about what you can do with MyChart, go to [email or web address].    Your next appointment:   1 year(s)  The format for your next appointment:   In Person  Provider:   Berniece Salines, DO   Other Instructions      Adopting a Healthy Lifestyle.  Know what a healthy weight is for you (roughly BMI <25) and aim to maintain this   Aim for 7+ servings of fruits and vegetables daily   65-80+ fluid ounces of water or unsweet tea for healthy kidneys   Limit to max 1 drink of alcohol per day; avoid smoking/tobacco   Limit animal fats in diet for cholesterol and heart health - choose grass fed whenever available   Avoid highly processed foods, and foods high in saturated/trans fats   Aim for low stress - take time to unwind and care for your mental health   Aim for 150  min of  moderate intensity exercise weekly for heart health, and weights twice weekly for bone health   Aim for 7-9 hours of sleep daily   When it comes to diets, agreement about the perfect plan isnt easy to find, even among the experts. Experts at the [employer] developed an idea known as the Healthy Eating Plate. Just imagine a plate divided into logical, healthy portions.   The emphasis is on diet quality:   Load up on vegetables and fruits - one-half of your plate: Aim for color and variety, and remember that potatoes dont count.   Go for whole grains - one-quarter of your plate: Whole wheat, barley, wheat berries, quinoa, oats, brown rice, and foods made with them. If you want pasta, go with whole wheat pasta.   Protein power - one-quarter of your plate: Fish, chicken, beans, and nuts are all healthy, versatile protein sources. Limit red meat.   The diet, however, does go beyond the plate, offering a few other suggestions.   Use healthy plant oils, such as olive, canola, soy, corn, sunflower and peanut. Check the labels, and avoid partially hydrogenated oil, which have unhealthy trans fats.   If youre thirsty, drink water. Coffee and tea are good in moderation, but skip sugary drinks and limit milk and dairy products to one or two daily servings.   The type of carbohydrate in the diet is more important than the amount. Some sources of carbohydrates, such as vegetables, fruits, whole grains, and beans-are healthier than others.   Finally, stay active  Signed, Erin Salines, DO  03/09/2021 1:39 PM     [facility]

## 2021-03-09 NOTE — Patient Instructions (Signed)
Medication Instructions:  Your physician has recommended you make the following change in your medication: STOP: Lipitor START: Pravastatin 40 mg once daily *If you need a refill on your cardiac medications before your next appointment, please call your pharmacy*   Lab Work: Your physician recommends that you return for lab work: TODAY: BMET, [place] If you have labs (blood work) drawn today and your tests are completely normal, you will receive your results only by: Marland Kitchen MyChart Message (if you have MyChart) OR . A paper copy in the mail If you have any lab test that is abnormal or we need to change your treatment, we will call you to review the results.   Testing/Procedures: None   Follow-Up: At [facility], you and your health needs are our priority.  As part of our continuing mission to provide you with exceptional heart care, we have created designated Provider Care Teams.  These Care Teams include your primary Cardiologist (physician) and Advanced Practice Providers (APPs -  Physician Assistants and Nurse Practitioners) who all work together to provide you with the care you need, when you need it.  We recommend signing up for the patient portal called "MyChart".  Sign up information is provided on this After Visit Summary.  MyChart is used to connect with patients for Virtual Visits (Telemedicine).  Patients are able to view lab/test results, encounter notes, upcoming appointments, etc.  Non-urgent messages can be sent to your provider as well.   To learn more about what you can do with MyChart, go to [email or web address].    Your next appointment:   1 year(s)  The format for your next appointment:   In Person  Provider:   Berniece Salines, DO   Other Instructions

## 2021-03-10 ENCOUNTER — Ambulatory Visit: Payer: BC Managed Care – PPO | Admitting: Cardiology

## 2021-03-10 ENCOUNTER — Other Ambulatory Visit (HOSPITAL_COMMUNITY)
Admission: RE | Admit: 2021-03-10 | Discharge: 2021-03-10 | Disposition: A | Discharge status: Home / Self Care | Payer: BC Managed Care – PPO | Source: Ambulatory Visit | Attending: Obstetrics and Gynecology | Admitting: Obstetrics and Gynecology

## 2021-03-10 ENCOUNTER — Ambulatory Visit (INDEPENDENT_AMBULATORY_CARE_PROVIDER_SITE_OTHER): Payer: BC Managed Care – PPO | Admitting: Obstetrics and Gynecology

## 2021-03-10 ENCOUNTER — Encounter: Payer: Self-pay | Admitting: Obstetrics and Gynecology

## 2021-03-10 VITALS — BP 124/76 | HR 63 | Ht 63.0 in | Wt 187.0 lb

## 2021-03-10 DIAGNOSIS — Z8041 Family history of malignant neoplasm of ovary: Secondary | ICD-10-CM | POA: Diagnosis not present

## 2021-03-10 DIAGNOSIS — Z01419 Encounter for gynecological examination (general) (routine) without abnormal findings: Secondary | ICD-10-CM | POA: Insufficient documentation

## 2021-03-10 LAB — BASIC METABOLIC PANEL
BUN/Creatinine Ratio: 19 (ref 9–23)
BUN: 16 mg/dL (ref 6–24)
CO2: 20 mmol/L (ref 20–29)
Calcium: 9.4 mg/dL (ref 8.7–10.2)
Chloride: 103 mmol/L (ref 96–106)
Creatinine, Ser: 0.86 mg/dL (ref 0.57–1.00)
Glucose: 113 mg/dL — ABNORMAL HIGH (ref 65–99)
Potassium: 4 mmol/L (ref 3.5–5.2)
Sodium: 143 mmol/L (ref 134–144)
eGFR: 78 mL/min/{1.73_m2} (ref >59)

## 2021-03-10 LAB — MAGNESIUM: Magnesium: 2.3 mg/dL (ref 1.6–2.3)

## 2021-03-10 NOTE — Progress Notes (Signed)
60 y.o. G37P0000 Married Caucasian female here for annual exam.    Denies vaginal bleeding.   Zoloft helps to treat anxiety.   Taking a MVI and biotin.   PCP:   Gilford Rile, MD  Patient's last menstrual period was 03/09/2015 (within months).           Sexually active: No.  The current method of family planning is none.    Exercising: No.    Smoker:  no  Health Maintenance: Pap:12-13-17 neg HPV neg History of abnormal Pap:  yes MMG:09-20-20 norm Colonoscopy:2021 norm MPN:TIRWE TDaP:no Gardasil:   no RXV:QMGQQ Hep C:2021 neg Screening Labs:  Hb today:PCP, Urine today: no   reports that she has never smoked. She has never used smokeless tobacco. She reports that she does not drink alcohol and does not use drugs.  Past Medical History:  Diagnosis Date  . Abnormal Pap smear of cervix 2008?   HPV  . 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 Anxiety   . Aortic valve disorder 05/07/2016  . Bicuspid aortic valve 05/07/2016   Last Assessment & Plan:  Reviewed her last [place] she had which was stable for her  . Chronic idiopathic gout of multiple sites 05/15/2016   Last Assessment & Plan:  Increase this uloric to 80 mg daily for her and she can use the colchicine for flares hopefully rather than try to take daily  . Chronic recurrent major depressive disorder ([place]) 02/17/2016   Last Assessment & Plan:  Relevant Hx: Course: Daily Update: Today's Plan:increase the zoloft for her  Electronically signed by: Mayer Camel, NP 02/20/16 1107  . Colon polyp    at age 65  . Encounter for long-term current use of high risk medication 02/17/2016  . Essential hypertension 02/17/2016   Last Assessment & Plan:  Relevant Hx: Course: Daily Update: Today's Plan:better on recheck for her continue with her current dose of med  Electronically signed by: Mayer Camel, NP 02/20/16 1103  . Family history of colon cancer   . Family history of ovarian cancer   . Fatty liver   . GERD  (gastroesophageal reflux disease)   . Hallux valgus, acquired 05/04/2016  . History of kidney stones   . Hyperlipidemia   . Hypertension   . Hypokalemia 02/17/2016  . Hypothyroidism (acquired) 02/17/2016   Last Assessment & Plan:  Relevant Hx: Course: Daily Update: Today's Plan:she has been stable in the past continue to follow  Electronically signed by: Mayer Camel, NP 02/20/16 1103  . Kidney stone 05/07/2016  . Localized edema 02/17/2016   Last Assessment & Plan:  Relevant Hx: Course: Daily Update: Today's Plan:is stable with her torsemide but today has increased due to her foot pain and presumed gout  Electronically signed by: Mayer Camel, NP 02/20/16 1107  . Malaise and fatigue 02/17/2016   Last Assessment & Plan:  Relevant Hx: Course: Daily Update: Today's Plan:this is off and on for her and will follow this for her and discussed her mood as she is not sure that the zoloft increase will not help her more and will do this for her  Electronically signed by: Mayer Camel, NP 02/20/16 2055  . Mixed hyperlipidemia 02/17/2016   Last Assessment & Plan:  Relevant Hx: Course: Daily Update: Today's Plan:not taking her med as she should be due to having had increased myalgias and will continue to follow this for her and see where her level is at this time  Electronically signed by:  Mayer Camel, NP 02/20/16 1104  . Palpitation 10/13/2019  . Primary insomnia 02/17/2016   Last Assessment & Plan:  Relevant Hx: Course: Daily Update: Today's Plan:this is stable for her  Electronically signed by: Mayer Camel, NP 02/20/16 1106  . Thyroid disease   . Vitamin D deficiency 02/17/2016    Past Surgical History:  Procedure Laterality Date  . 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 CHOLECYSTECTOMY    . COLPOSCOPY  2008  . PARATHYROIDECTOMY    . TONSILLECTOMY      Current Outpatient Medications  Medication Sig  Dispense Refill  . carvedilol (COREG) 6.25 MG tablet Take 6.25 mg by mouth 2 (two) times daily.    Marland Kitchen esomeprazole (NEXIUM) 40 MG capsule   0  . losartan (COZAAR) 100 MG tablet Take 100 mg by mouth daily.    . pravastatin (PRAVACHOL) 40 MG tablet Take 1 tablet (40 mg total) by mouth every evening. 90 tablet 3  . sertraline (ZOLOFT) 50 MG tablet Take 1.5 tablets by mouth daily.    Marland Kitchen torsemide (DEMADEX) 20 MG tablet Take 1.5 tablets by mouth daily.     No current facility-administered medications for this visit.    Family History  Problem Relation Age of Onset  . Colon cancer Mother 27  . Leukemia Father 73  . Hypertension Father   . Heart attack Father   . Ovarian cancer Maternal Aunt        dx in her late 44s  . Ovarian cancer Maternal Grandmother 29  . Kidney cancer Maternal Uncle        died in early 64s  . Heart attack Maternal Grandfather   . Heart attack Paternal Grandfather   . Breast cancer Neg Hx     Review of Systems  Exam:   BP 124/76 (BP Location: Left Arm, Patient Position: Sitting, Cuff Size: Normal)   Pulse 63   Ht 5\' 3"  (1.6 m)   Wt 187 lb (84.8 kg)   LMP 03/09/2015 (Within Months)   SpO2 98%   BMI 33.13 kg/m     General appearance: alert, cooperative and appears stated age Head: normocephalic, without obvious abnormality, atraumatic Neck: no adenopathy, supple, symmetrical, trachea midline and thyroid normal to inspection and palpation Lungs: clear to auscultation bilaterally Breasts: normal appearance, no masses or tenderness, No nipple retraction or dimpling, No nipple discharge or bleeding, No axillary adenopathy.  Seborrheic keratosis of bilateral breasts. Heart: regular rate and rhythm Abdomen: soft, non-tender; no masses, no organomegaly Extremities: extremities normal, atraumatic, no cyanosis or edema Skin: skin color, texture, turgor normal. No rashes or lesions Lymph nodes: cervical, supraclavicular, and axillary nodes normal. Neurologic:  grossly normal  Pelvic: External genitalia:  no lesions              No abnormal inguinal nodes palpated.              Urethra:  normal appearing urethra with no masses, tenderness or lesions              Bartholins and Skenes: normal                 Vagina: normal appearing vagina with normal color and discharge, no lesions.  Atrophy noted.               Cervix: no lesions              Pap taken:  Yes.   Bimanual Exam:  Uterus:  normal size, contour, position, consistency, mobility, non-tender              Adnexa: no mass, fullness, tenderness              Rectal exam: Yes.  .  Confirms.              Anus:  normal sphincter tone, no lesions  Chaperone was present for exam.  Assessment:   Well woman visit with normal exam. FH ovarian and colon cancer. Patient had negative genetic testing.Normal pelvic US 2020. Hx of LEEP 2007.  CIN II.  Plan: Mammogram screening discussed. Self breast awareness reviewed. Pap and HR HPV as above. Guidelines for Calcium, Vitamin D, regular exercise program including cardiovascular and weight bearing exercise. Return for pelvic US this year.  Labs with PCP and cardiology.   Follow up annually and prn.

## 2021-03-10 NOTE — Patient Instructions (Signed)

## 2021-03-13 LAB — CYTOLOGY - PAP
Adequacy: ABSENT
Comment: NEGATIVE
Diagnosis: NEGATIVE
High risk HPV: NEGATIVE

## 2021-07-18 ENCOUNTER — Other Ambulatory Visit: Payer: BC Managed Care – PPO | Admitting: Obstetrics and Gynecology

## 2021-07-18 ENCOUNTER — Other Ambulatory Visit: Payer: BC Managed Care – PPO

## 2021-08-15 ENCOUNTER — Encounter: Payer: Self-pay | Admitting: Sports Medicine

## 2021-08-15 ENCOUNTER — Ambulatory Visit (INDEPENDENT_AMBULATORY_CARE_PROVIDER_SITE_OTHER): Payer: BC Managed Care – PPO

## 2021-08-15 ENCOUNTER — Encounter: Payer: Self-pay | Admitting: Obstetrics and Gynecology

## 2021-08-15 ENCOUNTER — Other Ambulatory Visit: Payer: Self-pay

## 2021-08-15 ENCOUNTER — Ambulatory Visit: Payer: BC Managed Care – PPO | Admitting: Sports Medicine

## 2021-08-15 ENCOUNTER — Ambulatory Visit (INDEPENDENT_AMBULATORY_CARE_PROVIDER_SITE_OTHER): Payer: BC Managed Care – PPO | Admitting: Obstetrics and Gynecology

## 2021-08-15 VITALS — BP 116/60 | HR 59 | Ht 63.0 in | Wt 187.0 lb

## 2021-08-15 DIAGNOSIS — Z8041 Family history of malignant neoplasm of ovary: Secondary | ICD-10-CM | POA: Diagnosis not present

## 2021-08-15 DIAGNOSIS — M84374A Stress fracture, right foot, initial encounter for fracture: Secondary | ICD-10-CM

## 2021-08-15 DIAGNOSIS — M779 Enthesopathy, unspecified: Secondary | ICD-10-CM

## 2021-08-15 DIAGNOSIS — M79671 Pain in right foot: Secondary | ICD-10-CM

## 2021-08-15 NOTE — Progress Notes (Signed)
Subjective: Erin Andrade is a 60 y.o. female patient who presents to office for evaluation of right foot pain.  Patient reports that it is pain to the dorsal and lateral foot started with swelling and redness at the level of the midfoot was seen last week by PCP who put her on Bactrim and gave her a Kenalog shot states that it is helped the redness and swelling but still has pain when she tries to put full pressure on the foot worse with shoes and with weightbearing.  Patient denies any known history of trauma or injury but does admit to a history of gout and at first she thought it was a gout flareup but this felt different than her previous flareups from before.  Patient denies any other pedal complaints at this time.  Patient Active Problem List   Diagnosis Date Noted   Mild aortic regurgitation 03/09/2021   Medication management 03/09/2021   Abnormal Pap smear of cervix    Anxiety    GERD (gastroesophageal reflux disease)    History of kidney stones    Hyperlipidemia    Hypertension    Thyroid disease    NSVT (nonsustained ventricular tachycardia) 06/29/2020   Fatigue 06/29/2020   Elevated transaminase level 02/29/2020   Fatty liver 02/29/2020   Stage 3a chronic kidney disease ([place]) 02/29/2020   Obesity (BMI 30-39.9) 10/23/2019   Palpitation 10/13/2019   Chronic idiopathic gout of multiple sites 05/15/2016   Aortic valve disorder 05/07/2016   Bicuspid aortic valve 05/07/2016   Kidney stone 05/07/2016   Hallux valgus, acquired 05/04/2016   Adrenal cortical adenoma 02/17/2016   Chronic recurrent major depressive disorder ([place]) 02/17/2016   Essential hypertension 02/17/2016   Hypokalemia 02/17/2016   Hypothyroidism (acquired) 02/17/2016   Localized edema 02/17/2016   Malaise and fatigue 02/17/2016   Mixed hyperlipidemia 02/17/2016   Primary insomnia 02/17/2016   Vitamin D deficiency 02/17/2016   Encounter for long-term current use of high risk medication 02/17/2016   Genetic  testing 04/19/2015   Colon polyp    Family history of ovarian cancer    Family history of colon cancer     Current Outpatient Medications on File Prior to Visit  Medication Sig Dispense Refill   losartan (COZAAR) 100 MG tablet Take 1 tablet by mouth daily.     allopurinol (ZYLOPRIM) 300 MG tablet Take 300 mg by mouth daily.     carvedilol (COREG) 6.25 MG tablet Take 6.25 mg by mouth 2 (two) times daily.     esomeprazole (NEXIUM) 40 MG capsule   0   fluconazole (DIFLUCAN) 150 MG tablet Take 150 mg by mouth every 3 (three) days.     latanoprost (XALATAN) 0.005 % ophthalmic solution 1 drop at bedtime.     levothyroxine (SYNTHROID) 25 MCG tablet Take 25 mcg by mouth daily.     losartan (COZAAR) 100 MG tablet Take 100 mg by mouth daily.     Multiple Vitamin (MULTIVITAMIN WITH MINERALS) TABS tablet Take 1 tablet by mouth daily.     pravastatin (PRAVACHOL) 40 MG tablet Take 1 tablet (40 mg total) by mouth every evening. 90 tablet 3   sertraline (ZOLOFT) 50 MG tablet Take 1.5 tablets by mouth daily.     sulfamethoxazole-trimethoprim (BACTRIM DS) 800-160 MG tablet Take 1 tablet by mouth 2 (two) times daily.     torsemide (DEMADEX) 20 MG tablet Take 1.5 tablets by mouth daily.     No current facility-administered medications on file prior to visit.  Allergies  Allergen Reactions   Lipitor [Atorvastatin] Other (See Comments)    Muscle/joint pain    Penicillins Rash    Objective:  General: Alert and oriented x3 in no acute distress  Dermatology: No open lesions bilateral lower extremities, no webspace macerations, no ecchymosis bilateral, all nails x 10 are well manicured.  Vascular: Dorsalis Pedis and Posterior Tibial pedal pulses palpable, Capillary Fill Time 3 seconds,(+) pedal hair growth bilateral, minimal trace edema noted to the right dorsal lateral foot, temperature gradient mildly increased to the dorsal lateral right foot.  Neurology: Gross sensation intact via light  touch.  Musculoskeletal: Mild tenderness with palpation at dorsal lateral midfoot at the base of the fourth and fifth metatarsals on the right with localized edema minimal warmth.  Pain is worsened with plantarflexion and inversion of the forefoot.  No pain to palpation to the lateral ankle or lateral ankle ligaments or peroneal tendon course.  Strength appears to be within normal limits with mild guarding due to pain.  Gait: Antalgic on the right  Xrays  Right foot   Impression: No acute fracture or dislocation however advised patient that it may be difficult to determine if she could be suffering with a stress fracture at this time, moderate bunion but currently asymptomatic to this area.  Assessment and Plan: Problem List Items Addressed This Visit   None Visit Diagnoses     Right foot pain    -  Primary   Relevant Orders   DG Foot Complete Right   Capsulitis       Tendonitis       Stress fracture of metatarsal bone of right foot, initial encounter       possible        -Complete examination performed -Xrays reviewed -Discussed treatement options for right foot pain -Rx surgical shoe for patient to use as directed -Dispensed Surgigrip compression sleeve for patient to use as directed-encouraged rest ice elevation and topical pain cream or rub as needed -Advised over-the-counter Tylenol if needed for pain -No injection given at this time since patient had Kenalog and no additional antibiotics given since patient has completed Bactrim -Patient to return to office in 3 weeks or sooner if condition worsens.  Advised patient at next visit we will rex-ray to see if there is any signs of any delayed/stress fracture.  Landis Martins, DPM

## 2021-08-15 NOTE — Patient Instructions (Signed)
Shoe List For tennis shoes recommend:  Girard New balance Saucony HOKA Can be purchased at [employer] sports or [employer] arch fit Can be purchased at any major retailers  Vionic  SAS Can be purchased at [employer] or [employer]   For work shoes recommend: [employer] Work [employer] Can be purchased at a variety of places or [employer]   For casual shoes recommend: Oofos Can be purchased at [employer] sports or [employer]  Can be purchased at [employer] or [place] recommend: Power Steps Can be purchased in office/[facility] [employer] Can be purchased at [employer] sports or [employer] Can be purchased at [place] recommend: Leisure centre manager at [employer] and [employer]

## 2021-08-15 NOTE — Progress Notes (Signed)
GYNECOLOGY  VISIT   HPI: 60 y.o.   Married  Caucasian  female   [id number] with Patient's last menstrual period was 03/09/2015 (within months).   here for pelvic ultrasound.     GYNECOLOGIC HISTORY: Patient's last menstrual period was 03/09/2015 (within months). Contraception:  PMP Menopausal hormone therapy: None Last mammogram:  09-20-20  3D//Neg/Birads1 Last pap smear:  12-13-17 Neg:Neg HR HPV        OB History     Gravida  0   Para  0   Term  0   Preterm  0   AB  0   Living  0      SAB  0   IAB  0   Ectopic  0   Multiple  0   Live Births                 Patient Active Problem List   Diagnosis Date Noted   Mild aortic regurgitation 03/09/2021   Medication management 03/09/2021   Abnormal Pap smear of cervix    Anxiety    GERD (gastroesophageal reflux disease)    History of kidney stones    Hyperlipidemia    Hypertension    Thyroid disease    NSVT (nonsustained ventricular tachycardia) 06/29/2020   Fatigue 06/29/2020   Elevated transaminase level 02/29/2020   Fatty liver 02/29/2020   Stage 3a chronic kidney disease ([place]) 02/29/2020   Obesity (BMI 30-39.9) 10/23/2019   Palpitation 10/13/2019   Chronic idiopathic gout of multiple sites 05/15/2016   Aortic valve disorder 05/07/2016   Bicuspid aortic valve 05/07/2016   Kidney stone 05/07/2016   Hallux valgus, acquired 05/04/2016   Adrenal cortical adenoma 02/17/2016   Chronic recurrent major depressive disorder ([place]) 02/17/2016   Essential hypertension 02/17/2016   Hypokalemia 02/17/2016   Hypothyroidism (acquired) 02/17/2016   Localized edema 02/17/2016   Malaise and fatigue 02/17/2016   Mixed hyperlipidemia 02/17/2016   Primary insomnia 02/17/2016   Vitamin D deficiency 02/17/2016   Encounter for long-term current use of high risk medication 02/17/2016   Genetic testing 04/19/2015   Colon polyp    Family history of ovarian cancer    Family history of colon cancer     Past Medical  History:  Diagnosis Date   Abnormal Pap smear of cervix 2008?   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   Anxiety    Aortic valve disorder 05/07/2016   Bicuspid aortic valve 05/07/2016   Last Assessment & Plan:  Reviewed her last [place] she had which was stable for her   Chronic idiopathic gout of multiple sites 05/15/2016   Last Assessment & Plan:  Increase this uloric to 80 mg daily for her and she can use the colchicine for flares hopefully rather than try to take daily   Chronic recurrent major depressive disorder ([place]) 02/17/2016   Last Assessment & Plan:  Relevant Hx: Course: Daily Update: Today's Plan:increase the zoloft for her  Electronically signed by: Mayer Camel, NP 02/20/16 1107   Colon polyp    at age 60   Encounter for long-term current use of high risk medication 02/17/2016   Essential hypertension 02/17/2016   Last Assessment & Plan:  Relevant Hx: Course: Daily Update: Today's Plan:better on recheck for her continue with her current dose of med  Electronically signed by: Mayer Camel, NP 02/20/16 1103   Family history of colon cancer    Family history of ovarian cancer    Fatty liver  GERD (gastroesophageal reflux disease)    Hallux valgus, acquired 05/04/2016   History of kidney stones    Hyperlipidemia    Hypertension    Hypokalemia 02/17/2016   Hypothyroidism (acquired) 02/17/2016   Last Assessment & Plan:  Relevant Hx: Course: Daily Update: Today's Plan:she has been stable in the past continue to follow  Electronically signed by: Mayer Camel, NP 02/20/16 1103   Kidney stone 05/07/2016   Localized edema 02/17/2016   Last Assessment & Plan:  Relevant Hx: Course: Daily Update: Today's Plan:is stable with her torsemide but today has increased due to her foot pain and presumed gout  Electronically signed by: Mayer Camel, NP 02/20/16 1107   Malaise and fatigue 02/17/2016   Last Assessment & Plan:  Relevant Hx: Course: Daily  Update: Today's Plan:this is off and on for her and will follow this for her and discussed her mood as she is not sure that the zoloft increase will not help her more and will do this for her  Electronically signed by: Mayer Camel, NP 02/20/16 2055   Mixed hyperlipidemia 02/17/2016   Last Assessment & Plan:  Relevant Hx: Course: Daily Update: Today's Plan:not taking her med as she should be due to having had increased myalgias and will continue to follow this for her and see where her level is at this time  Electronically signed by: Mayer Camel, NP 02/20/16 1104   Palpitation 10/13/2019   Primary insomnia 02/17/2016   Last Assessment & Plan:  Relevant Hx: Course: Daily Update: Today's Plan:this is stable for her  Electronically signed by: Mayer Camel, NP 02/20/16 1106   Thyroid disease    Vitamin D deficiency 02/17/2016    Past Surgical History:  Procedure Laterality Date   CERVICAL BIOPSY  W/ LOOP ELECTRODE EXCISION  2007   CIN II - Dr. Quincy Simmonds   CHOLECYSTECTOMY     COLPOSCOPY  2008   PARATHYROIDECTOMY     TONSILLECTOMY      Current Outpatient Medications  Medication Sig Dispense Refill   allopurinol (ZYLOPRIM) 300 MG tablet Take 300 mg by mouth daily.     carvedilol (COREG) 6.25 MG tablet Take 6.25 mg by mouth 2 (two) times daily.     esomeprazole (NEXIUM) 40 MG capsule   0   latanoprost (XALATAN) 0.005 % ophthalmic solution 1 drop at bedtime.     levothyroxine (SYNTHROID) 25 MCG tablet Take 25 mcg by mouth daily.     losartan (COZAAR) 100 MG tablet Take 100 mg by mouth daily.     losartan (COZAAR) 100 MG tablet Take 1 tablet by mouth daily.     pravastatin (PRAVACHOL) 40 MG tablet Take 1 tablet (40 mg total) by mouth every evening. 90 tablet 3   sertraline (ZOLOFT) 50 MG tablet Take 1.5 tablets by mouth daily.     torsemide (DEMADEX) 20 MG tablet Take 1.5 tablets by mouth daily.     No current facility-administered medications for this visit.      ALLERGIES: Lipitor [atorvastatin] and Penicillins  Family History  Problem Relation Age of Onset   Colon cancer Mother 63   Leukemia Father 20   Hypertension Father    Heart attack Father    Ovarian cancer Maternal Aunt        dx in her late 18s   Ovarian cancer Maternal Grandmother 32   Kidney cancer Maternal Uncle        died in early 38s   Heart attack Maternal  Grandfather    Heart attack Paternal Grandfather    Breast cancer Neg Hx     Social History   Socioeconomic History   Marital status: Married    Spouse name: Not on file   Number of children: 0   Years of education: Not on file   Highest education level: Not on file  Occupational History   Not on file  Tobacco Use   Smoking status: Never   Smokeless tobacco: Never  Vaping Use   Vaping Use: Never used  Substance and Sexual Activity   Alcohol use: No    Alcohol/week: 0.0 standard drinks   Drug use: No   Sexual activity: Not Currently    Partners: Male  Other Topics Concern   Not on file  Social History Narrative   Not on file   Social Determinants of Health   Financial Resource Strain: Not on file  Food Insecurity: Not on file  Transportation Needs: Not on file  Physical Activity: Not on file  Stress: Not on file  Social Connections: Not on file  Intimate Partner Violence: Not on file    Review of Systems  All other systems reviewed and are negative.  PHYSICAL EXAMINATION:    BP 116/60   Pulse (!) 59   Ht 5\' 3"  (1.6 m)   Wt 187 lb (84.8 kg)   LMP 03/09/2015 (Within Months)   SpO2 98%   BMI 33.13 kg/m     General appearance: alert, cooperative and appears stated age   Pelvic US Uterus 6.31 x 4.16 x 2.4 cm.  Slightly heterogeneous myometrium and no masses.  EMS 3.64 mm. Symmetrical. Left and right ovaries normal.  No adnexal masses.  No free fluid.   ASSESSMENT  FH ovarian cancer in mother, maternal aunt, and maternal grandmother.  Patient with negative genetic testing.   Unremarkable pelvic ultrasound with normal ovaries.  PLAN  Normal pelvic US images and report reviewed with patient.  Signs and symptoms of ovarian cancer reviewed with patient.  FU for annual exam and prn.  An After Visit Summary was printed and given to the patient.  11 min  total time was spent for this patient encounter, including preparation, face-to-face counseling with the patient, coordination of care, and documentation of the encounter.

## 2021-08-18 ENCOUNTER — Other Ambulatory Visit: Payer: Self-pay | Admitting: Sports Medicine

## 2021-08-18 DIAGNOSIS — M779 Enthesopathy, unspecified: Secondary | ICD-10-CM

## 2021-09-06 ENCOUNTER — Ambulatory Visit: Payer: BC Managed Care – PPO | Admitting: Sports Medicine

## 2021-09-09 ENCOUNTER — Other Ambulatory Visit: Payer: Self-pay | Admitting: Cardiology

## 2021-10-20 ENCOUNTER — Encounter: Payer: Self-pay | Admitting: Sports Medicine

## 2021-10-20 ENCOUNTER — Ambulatory Visit (INDEPENDENT_AMBULATORY_CARE_PROVIDER_SITE_OTHER): Payer: BC Managed Care – PPO

## 2021-10-20 ENCOUNTER — Other Ambulatory Visit: Payer: Self-pay

## 2021-10-20 ENCOUNTER — Ambulatory Visit: Payer: BC Managed Care – PPO | Admitting: Sports Medicine

## 2021-10-20 DIAGNOSIS — M779 Enthesopathy, unspecified: Secondary | ICD-10-CM

## 2021-10-20 DIAGNOSIS — M84374A Stress fracture, right foot, initial encounter for fracture: Secondary | ICD-10-CM

## 2021-10-20 DIAGNOSIS — M79671 Pain in right foot: Secondary | ICD-10-CM | POA: Diagnosis not present

## 2021-10-20 DIAGNOSIS — M10471 Other secondary gout, right ankle and foot: Secondary | ICD-10-CM | POA: Diagnosis not present

## 2021-10-20 DIAGNOSIS — M84374D Stress fracture, right foot, subsequent encounter for fracture with routine healing: Secondary | ICD-10-CM | POA: Diagnosis not present

## 2021-10-20 MED ORDER — INDOMETHACIN 50 MG PO CAPS: 50.0000 mg | ORAL_CAPSULE | Freq: Two times a day (BID) | ORAL | 0 refills | Status: DC

## 2021-10-21 NOTE — Progress Notes (Signed)
Subjective: Erin Andrade is a 61 y.o. female patient who presents to office for follow up evaluation of right foot pain.  Patient reports that it is pain to the dorsal and lateral foot started with swelling and redness at the level of the midfoot started back again.  Patient denies any other pedal complaints at this time.  Patient Active Problem List   Diagnosis Date Noted   Mild aortic regurgitation 03/09/2021   Medication management 03/09/2021   Abnormal Pap smear of cervix    Anxiety    GERD (gastroesophageal reflux disease)    History of kidney stones    Hyperlipidemia    Hypertension    Thyroid disease    NSVT (nonsustained ventricular tachycardia) 06/29/2020   Fatigue 06/29/2020   Elevated transaminase level 02/29/2020   Fatty liver 02/29/2020   Stage 3a chronic kidney disease ([place]) 02/29/2020   Obesity (BMI 30-39.9) 10/23/2019   Palpitation 10/13/2019   Chronic idiopathic gout of multiple sites 05/15/2016   Aortic valve disorder 05/07/2016   Bicuspid aortic valve 05/07/2016   Kidney stone 05/07/2016   Hallux valgus, acquired 05/04/2016   Adrenal cortical adenoma 02/17/2016   Chronic recurrent major depressive disorder ([place]) 02/17/2016   Essential hypertension 02/17/2016   Hypokalemia 02/17/2016   Hypothyroidism (acquired) 02/17/2016   Localized edema 02/17/2016   Malaise and fatigue 02/17/2016   Mixed hyperlipidemia 02/17/2016   Primary insomnia 02/17/2016   Vitamin D deficiency 02/17/2016   Encounter for long-term current use of high risk medication 02/17/2016   Genetic testing 04/19/2015   Colon polyp    Family history of ovarian cancer    Family history of colon cancer     Current Outpatient Medications on File Prior to Visit  Medication Sig Dispense Refill   atorvastatin (LIPITOR) 20 MG tablet Take 1 tablet by mouth at bedtime.     allopurinol (ZYLOPRIM) 300 MG tablet Take 300 mg by mouth daily.     carvedilol (COREG) 6.25 MG tablet TAKE 1 TABLET BY MOUTH  TWICE DAILY 180 tablet 3   clindamycin (CLEOCIN) 300 MG capsule Take 300 mg by mouth 2 (two) times daily.     colchicine 0.6 MG tablet Take by mouth.     esomeprazole (NEXIUM) 40 MG capsule   0   latanoprost (XALATAN) 0.005 % ophthalmic solution 1 drop at bedtime.     levothyroxine (SYNTHROID) 25 MCG tablet Take 25 mcg by mouth daily.     losartan (COZAAR) 100 MG tablet Take 100 mg by mouth daily.     losartan (COZAAR) 100 MG tablet Take 1 tablet by mouth daily.     pravastatin (PRAVACHOL) 40 MG tablet Take 1 tablet (40 mg total) by mouth every evening. 90 tablet 3   predniSONE (DELTASONE) 10 MG tablet Take by mouth.     sertraline (ZOLOFT) 50 MG tablet Take 1.5 tablets by mouth daily.     torsemide (DEMADEX) 20 MG tablet Take 1.5 tablets by mouth daily.     No current facility-administered medications on file prior to visit.    Allergies  Allergen Reactions   Lipitor [Atorvastatin] Other (See Comments)    Muscle/joint pain    Penicillins Rash    Objective:  General: Alert and oriented x3 in no acute distress  Dermatology: No open lesions bilateral lower extremities, no webspace macerations, no ecchymosis bilateral, all nails x 10 are well manicured.  Vascular: Dorsalis Pedis and Posterior Tibial pedal pulses palpable, Capillary Fill Time 3 seconds,(+) pedal hair growth bilateral,  minimal trace edema noted to the right dorsal lateral foot, temperature gradient mildly increased to the dorsal lateral right foot.  Neurology: Gross sensation intact via light touch.  Musculoskeletal: Mild tenderness with palpation at dorsal lateral midfoot at the base of the fourth and fifth metatarsals on the right with localized edema minimal warmth to dorsolateral right foot.  Pain is worsened with plantarflexion and inversion of the forefoot.  No pain to palpation to the lateral ankle or lateral ankle ligaments or peroneal tendon course.  Strength appears to be within normal limits with mild guarding  due to pain.  Gait: Antalgic on the right  Xrays  Right foot   Impression: No acute fracture or dislocation, moderate bunion but currently asymptomatic to this area.  Assessment and Plan: Problem List Items Addressed This Visit   None Visit Diagnoses     Other secondary acute gout of right foot    -  Primary   Relevant Medications   colchicine 0.6 MG tablet   predniSONE (DELTASONE) 10 MG tablet   indomethacin (INDOCIN) 50 MG capsule   Capsulitis       Tendonitis       Right foot pain       Stress fracture of metatarsal bone of right foot with routine healing, subsequent encounter       Relevant Orders   DG Foot Complete Right        -Complete examination performed -Re-xray is negative -Discussed treatement options for right foot pain ? Gout elevated UA -Rx Indomethacin  -May return to surgical shoe as needed  -Encouraged compression, rest ice elevation and topical pain cream or rub as needed -Advised over-the-counter Tylenol if needed for pain -Patient to return to office in 4 weeks if no better may need MRI and Rheumatology referral  Landis Martins, DPM

## 2021-10-27 ENCOUNTER — Telehealth: Payer: Self-pay | Admitting: *Deleted

## 2021-10-27 NOTE — Telephone Encounter (Signed)
Called and left a message for the patient and relayed the message per Dr Cannon Kettle. Lattie Haw

## 2021-10-27 NOTE — Telephone Encounter (Signed)
-----   Message from Landis Martins, [place] sent at 10/27/2021  8:03 AM EST ----- Have the patient to discontinue or stop the medication. Thanks Dr. Cannon Kettle ----- Message ----- From: Viviana Simpler, [facility] Sent: 10/27/2021   7:48 AM EST To: Landis Martins, DPM  Patient called and stated that the indomethacin was still making her dizzy and nauseous even after cutting the dosage down. Please Advise Lattie Haw

## 2021-11-03 ENCOUNTER — Other Ambulatory Visit: Payer: Self-pay | Admitting: Sports Medicine

## 2021-11-06 NOTE — Telephone Encounter (Signed)
Please advise 

## 2021-11-17 ENCOUNTER — Ambulatory Visit: Payer: BC Managed Care – PPO | Admitting: Sports Medicine

## 2021-11-17 DIAGNOSIS — M79671 Pain in right foot: Secondary | ICD-10-CM | POA: Diagnosis not present

## 2021-11-17 DIAGNOSIS — M79672 Pain in left foot: Secondary | ICD-10-CM

## 2021-11-17 DIAGNOSIS — M10471 Other secondary gout, right ankle and foot: Secondary | ICD-10-CM | POA: Diagnosis not present

## 2021-11-17 NOTE — Progress Notes (Signed)
Subjective: Erin Andrade is a 61 y.o. female patient who presents to office for follow up evaluation of right foot and now left foot pain. States that she is feeling overall better and that she switched her indomethacin to taking it at bedtime which has helped. Has about 10 more pills left. No other issues   Patient Active Problem List   Diagnosis Date Noted   Mild aortic regurgitation 03/09/2021   Medication management 03/09/2021   Abnormal Pap smear of cervix    Anxiety    GERD (gastroesophageal reflux disease)    History of kidney stones    Hyperlipidemia    Hypertension    Thyroid disease    NSVT (nonsustained ventricular tachycardia) 06/29/2020   Fatigue 06/29/2020   Elevated transaminase level 02/29/2020   Fatty liver 02/29/2020   Stage 3a chronic kidney disease ([place]) 02/29/2020   Obesity (BMI 30-39.9) 10/23/2019   Palpitation 10/13/2019   Chronic idiopathic gout of multiple sites 05/15/2016   Aortic valve disorder 05/07/2016   Bicuspid aortic valve 05/07/2016   Kidney stone 05/07/2016   Hallux valgus, acquired 05/04/2016   Adrenal cortical adenoma 02/17/2016   Chronic recurrent major depressive disorder ([place]) 02/17/2016   Essential hypertension 02/17/2016   Hypokalemia 02/17/2016   Hypothyroidism (acquired) 02/17/2016   Localized edema 02/17/2016   Malaise and fatigue 02/17/2016   Mixed hyperlipidemia 02/17/2016   Primary insomnia 02/17/2016   Vitamin D deficiency 02/17/2016   Encounter for long-term current use of high risk medication 02/17/2016   Genetic testing 04/19/2015   Colon polyp    Family history of ovarian cancer    Family history of colon cancer     Current Outpatient Medications on File Prior to Visit  Medication Sig Dispense Refill   allopurinol (ZYLOPRIM) 300 MG tablet Take 300 mg by mouth daily.     atorvastatin (LIPITOR) 20 MG tablet Take 1 tablet by mouth at bedtime.     carvedilol (COREG) 6.25 MG tablet TAKE 1 TABLET BY MOUTH TWICE DAILY  180 tablet 3   clindamycin (CLEOCIN) 300 MG capsule Take 300 mg by mouth 2 (two) times daily.     colchicine 0.6 MG tablet Take by mouth.     esomeprazole (NEXIUM) 40 MG capsule   0   indomethacin (INDOCIN) 50 MG capsule TAKE 1 CAPSULE(50 MG) BY MOUTH TWICE DAILY WITH A MEAL 30 capsule 0   latanoprost (XALATAN) 0.005 % ophthalmic solution 1 drop at bedtime.     levothyroxine (SYNTHROID) 25 MCG tablet Take 25 mcg by mouth daily.     losartan (COZAAR) 100 MG tablet Take 100 mg by mouth daily.     losartan (COZAAR) 100 MG tablet Take 1 tablet by mouth daily.     pravastatin (PRAVACHOL) 40 MG tablet Take 1 tablet (40 mg total) by mouth every evening. 90 tablet 3   predniSONE (DELTASONE) 10 MG tablet Take by mouth.     sertraline (ZOLOFT) 50 MG tablet Take 1.5 tablets by mouth daily.     torsemide (DEMADEX) 20 MG tablet Take 1.5 tablets by mouth daily.     No current facility-administered medications on file prior to visit.    Allergies  Allergen Reactions   Lipitor [Atorvastatin] Other (See Comments)    Muscle/joint pain    Penicillins Rash    Objective:  General: Alert and oriented x3 in no acute distress  Dermatology: No open lesions bilateral lower extremities, no webspace macerations, no ecchymosis bilateral, all nails x 10 are well manicured.No  erythema. No swelling bilateral.   Neurovascular status: Intact   Musculoskeletal: Mild tenderness with palpation at dorsal lateral midfoot at the base of the fourth and fifth metatarsals on the left now but no pain to right dorsolateral right foot.  No pain with range of motion.  Strength appears to be within normal limits.   Assessment and Plan: Problem List Items Addressed This Visit   None Visit Diagnoses     Other secondary acute gout of right foot    -  Primary   Right foot pain       Left foot pain            -Complete examination performed -Re-Discussed treatement options for right foot pain ? Gout elevated UA Advised  patient to continue with topical over-the-counter pain cream or rub as needed -Advised patient at this time we will give her symptoms more time to self resolve if fails to continue to improve may benefit from injection -Continue with indomethacin as directed for flareups and advised patient if this becomes recurrent may need to see rheumatologist or see her PCP for further management of gout -Return to office as needed or sooner if problems or issues arise

## 2021-12-04 ENCOUNTER — Other Ambulatory Visit: Payer: Self-pay | Admitting: Cardiology

## 2021-12-19 ENCOUNTER — Other Ambulatory Visit: Payer: Self-pay | Admitting: Obstetrics and Gynecology

## 2021-12-19 DIAGNOSIS — Z1231 Encounter for screening mammogram for malignant neoplasm of breast: Secondary | ICD-10-CM

## 2021-12-26 ENCOUNTER — Other Ambulatory Visit: Payer: Self-pay

## 2021-12-26 ENCOUNTER — Ambulatory Visit
Admission: RE | Admit: 2021-12-26 | Discharge: 2021-12-26 | Disposition: A | Discharge status: Home / Self Care | Payer: BC Managed Care – PPO | Source: Ambulatory Visit | Attending: Obstetrics and Gynecology | Admitting: Obstetrics and Gynecology

## 2021-12-26 DIAGNOSIS — Z1231 Encounter for screening mammogram for malignant neoplasm of breast: Secondary | ICD-10-CM

## 2022-03-08 NOTE — Progress Notes (Deleted)
61 y.o. G22P0000 Married Caucasian female here for annual exam.    PCP:  Gilford Rile, MD   Patient's last menstrual period was 03/09/2015 (within months).           Sexually active: {yes no:314532}  The current method of family planning is post menopausal status.    Exercising: {yes no:314532}  {types:19826} Smoker:  no  Health Maintenance: Pap:  03-10-21 Neg:Neg HR HPV, 12-13-17 Neg:Neg HR HPV, 03-11-15 Neg:Neg HR HPV History of abnormal Pap:  yes, 2007 LEEP, Hx HR HPV MMG:  12-26-21 Neg/BiRads1 Colonoscopy:  2021 normal BMD:   ***  Result  *** TDaP:  06-15-16 Gardasil:   {YES NO:22349} HIV:*** Hep C: 2021 Neg Screening Labs:  Hb today: ***, Urine today: ***   reports that she has never smoked. She has never used smokeless tobacco. She reports that she does not drink alcohol and does not use drugs.  Past Medical History:  Diagnosis Date   Abnormal Pap smear of cervix 2008?   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   Anxiety    Aortic valve disorder 05/07/2016   Bicuspid aortic valve 05/07/2016   Last Assessment & Plan:  Reviewed her last [place] she had which was stable for her   Chronic idiopathic gout of multiple sites 05/15/2016   Last Assessment & Plan:  Increase this uloric to 80 mg daily for her and she can use the colchicine for flares hopefully rather than try to take daily   Chronic recurrent major depressive disorder ([place]) 02/17/2016   Last Assessment & Plan:  Relevant Hx: Course: Daily Update: Today's Plan:increase the zoloft for her  Electronically signed by: Mayer Camel, NP 02/20/16 1107   Colon polyp    at age 82   Encounter for long-term current use of high risk medication 02/17/2016   Essential hypertension 02/17/2016   Last Assessment & Plan:  Relevant Hx: Course: Daily Update: Today's Plan:better on recheck for her continue with her current dose of med  Electronically signed by: Mayer Camel, NP 02/20/16 1103   Family history of colon cancer     Family history of ovarian cancer    Fatty liver    GERD (gastroesophageal reflux disease)    Hallux valgus, acquired 05/04/2016   History of kidney stones    Hyperlipidemia    Hypertension    Hypokalemia 02/17/2016   Hypothyroidism (acquired) 02/17/2016   Last Assessment & Plan:  Relevant Hx: Course: Daily Update: Today's Plan:she has been stable in the past continue to follow  Electronically signed by: Mayer Camel, NP 02/20/16 1103   Kidney stone 05/07/2016   Localized edema 02/17/2016   Last Assessment & Plan:  Relevant Hx: Course: Daily Update: Today's Plan:is stable with her torsemide but today has increased due to her foot pain and presumed gout  Electronically signed by: Mayer Camel, NP 02/20/16 1107   Malaise and fatigue 02/17/2016   Last Assessment & Plan:  Relevant Hx: Course: Daily Update: Today's Plan:this is off and on for her and will follow this for her and discussed her mood as she is not sure that the zoloft increase will not help her more and will do this for her  Electronically signed by: Mayer Camel, NP 02/20/16 2055   Mixed hyperlipidemia 02/17/2016   Last Assessment & Plan:  Relevant Hx: Course: Daily Update: Today's Plan:not taking her med as she should be due to having had increased myalgias and will continue to follow this  for her and see where her level is at this time  Electronically signed by: Mayer Camel, NP 02/20/16 1104   Palpitation 10/13/2019   Primary insomnia 02/17/2016   Last Assessment & Plan:  Relevant Hx: Course: Daily Update: Today's Plan:this is stable for her  Electronically signed by: Mayer Camel, NP 02/20/16 1106   Thyroid disease    Vitamin D deficiency 02/17/2016    Past Surgical History:  Procedure Laterality Date   CERVICAL BIOPSY  W/ LOOP ELECTRODE EXCISION  2007   CIN II - Dr. Quincy Simmonds   CHOLECYSTECTOMY     COLPOSCOPY  2008   PARATHYROIDECTOMY     TONSILLECTOMY      Current  Outpatient Medications  Medication Sig Dispense Refill   allopurinol (ZYLOPRIM) 300 MG tablet Take 300 mg by mouth daily.     atorvastatin (LIPITOR) 20 MG tablet Take 1 tablet by mouth at bedtime.     carvedilol (COREG) 6.25 MG tablet TAKE 1 TABLET BY MOUTH TWICE DAILY 180 tablet 3   clindamycin (CLEOCIN) 300 MG capsule Take 300 mg by mouth 2 (two) times daily.     colchicine 0.6 MG tablet Take by mouth.     esomeprazole (NEXIUM) 40 MG capsule   0   indomethacin (INDOCIN) 50 MG capsule TAKE 1 CAPSULE(50 MG) BY MOUTH TWICE DAILY WITH A MEAL 30 capsule 0   latanoprost (XALATAN) 0.005 % ophthalmic solution 1 drop at bedtime.     levothyroxine (SYNTHROID) 25 MCG tablet Take 25 mcg by mouth daily.     losartan (COZAAR) 100 MG tablet Take 100 mg by mouth daily.     losartan (COZAAR) 100 MG tablet Take 1 tablet by mouth daily.     pravastatin (PRAVACHOL) 40 MG tablet Take 1 tablet (40 mg total) by mouth every evening. 90 tablet 3   predniSONE (DELTASONE) 10 MG tablet Take by mouth.     sertraline (ZOLOFT) 50 MG tablet Take 1.5 tablets by mouth daily.     torsemide (DEMADEX) 20 MG tablet Take 1.5 tablets by mouth daily.     No current facility-administered medications for this visit.    Family History  Problem Relation Age of Onset   Colon cancer Mother 61   Leukemia Father 90   Hypertension Father    Heart attack Father    Ovarian cancer Maternal Aunt        dx in her late 43s   Ovarian cancer Maternal Grandmother 4   Kidney cancer Maternal Uncle        died in early 68s   Heart attack Maternal Grandfather    Heart attack Paternal Grandfather    Breast cancer Neg Hx     Review of Systems  Exam:   LMP 03/09/2015 (Within Months)     General appearance: alert, cooperative and appears stated age Head: normocephalic, without obvious abnormality, atraumatic Neck: no adenopathy, supple, symmetrical, trachea midline and thyroid normal to inspection and palpation Lungs: clear to  auscultation bilaterally Breasts: normal appearance, no masses or tenderness, No nipple retraction or dimpling, No nipple discharge or bleeding, No axillary adenopathy Heart: regular rate and rhythm Abdomen: soft, non-tender; no masses, no organomegaly Extremities: extremities normal, atraumatic, no cyanosis or edema Skin: skin color, texture, turgor normal. No rashes or lesions Lymph nodes: cervical, supraclavicular, and axillary nodes normal. Neurologic: grossly normal  Pelvic: External genitalia:  no lesions              No abnormal inguinal nodes  palpated.              Urethra:  normal appearing urethra with no masses, tenderness or lesions              Bartholins and Skenes: normal                 Vagina: normal appearing vagina with normal color and discharge, no lesions              Cervix: no lesions              Pap taken: {yes no:314532} Bimanual Exam:  Uterus:  normal size, contour, position, consistency, mobility, non-tender              Adnexa: no mass, fullness, tenderness              Rectal exam: {yes no:314532}.  Confirms.              Anus:  normal sphincter tone, no lesions  Chaperone was present for exam:  ***  Assessment:   Well woman visit with gynecologic exam.   Plan: Mammogram screening discussed. Self breast awareness reviewed. Pap and HR HPV as above. Guidelines for Calcium, Vitamin D, regular exercise program including cardiovascular and weight bearing exercise.   Follow up annually and prn.   Additional counseling given.  {yes [id number]. _______ minutes face to face time of which over 50% was spent in counseling.    After visit summary provided.

## 2022-03-12 ENCOUNTER — Ambulatory Visit: Payer: BC Managed Care – PPO | Admitting: Obstetrics and Gynecology

## 2022-04-12 DIAGNOSIS — E039 Hypothyroidism, unspecified: Secondary | ICD-10-CM | POA: Diagnosis not present

## 2022-04-12 DIAGNOSIS — Z79899 Other long term (current) drug therapy: Secondary | ICD-10-CM | POA: Diagnosis not present

## 2022-04-12 DIAGNOSIS — M1A09X0 Idiopathic chronic gout, multiple sites, without tophus (tophi): Secondary | ICD-10-CM | POA: Diagnosis not present

## 2022-04-12 DIAGNOSIS — R5381 Other malaise: Secondary | ICD-10-CM | POA: Diagnosis not present

## 2022-04-12 DIAGNOSIS — R5383 Other fatigue: Secondary | ICD-10-CM | POA: Diagnosis not present

## 2022-04-12 DIAGNOSIS — N200 Calculus of kidney: Secondary | ICD-10-CM | POA: Diagnosis not present

## 2022-04-12 DIAGNOSIS — R7401 Elevation of levels of liver transaminase levels: Secondary | ICD-10-CM | POA: Diagnosis not present

## 2022-04-12 DIAGNOSIS — E876 Hypokalemia: Secondary | ICD-10-CM | POA: Diagnosis not present

## 2022-04-12 DIAGNOSIS — R69 Illness, unspecified: Secondary | ICD-10-CM | POA: Diagnosis not present

## 2022-04-12 DIAGNOSIS — I129 Hypertensive chronic kidney disease with stage 1 through stage 4 chronic kidney disease, or unspecified chronic kidney disease: Secondary | ICD-10-CM | POA: Diagnosis not present

## 2022-04-12 DIAGNOSIS — N1831 Chronic kidney disease, stage 3a: Secondary | ICD-10-CM | POA: Diagnosis not present

## 2022-04-12 DIAGNOSIS — K760 Fatty (change of) liver, not elsewhere classified: Secondary | ICD-10-CM | POA: Diagnosis not present

## 2022-04-12 DIAGNOSIS — E782 Mixed hyperlipidemia: Secondary | ICD-10-CM | POA: Diagnosis not present

## 2022-04-12 DIAGNOSIS — Q231 Congenital insufficiency of aortic valve: Secondary | ICD-10-CM | POA: Diagnosis not present

## 2022-04-12 DIAGNOSIS — R600 Localized edema: Secondary | ICD-10-CM | POA: Diagnosis not present

## 2022-04-13 NOTE — Progress Notes (Unsigned)
60 y.o. G64P0000 Married Caucasian female here for annual exam.    PCP:   Gilford Rile, MD  Patient's last menstrual period was 03/09/2015 (within months).           Sexually active: {yes no:314532}  The current method of family planning is post menopausal status.    Exercising: {yes no:314532}  {types:19826} Smoker:  no  Health Maintenance: Pap:  12-13-17 Neg:Neg HR HPV History of abnormal Pap:  ***Yes, 2007 Hx of LEEP MMG:  12-26-21 Neg/BiRads1 Colonoscopy:  2021 normal BMD:   ***  Result  *** TDaP:  06-15-16 Gardasil:   n/a HIV: *** Hep C: *** Screening Labs:  Hb today: ***, Urine today: ***   reports that she has never smoked. She has never used smokeless tobacco. She reports that she does not drink alcohol and does not use drugs.  Past Medical History:  Diagnosis Date   Abnormal Pap smear of cervix 2008?   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   Anxiety    Aortic valve disorder 05/07/2016   Bicuspid aortic valve 05/07/2016   Last Assessment & Plan:  Reviewed her last [place] she had which was stable for her   Chronic idiopathic gout of multiple sites 05/15/2016   Last Assessment & Plan:  Increase this uloric to 80 mg daily for her and she can use the colchicine for flares hopefully rather than try to take daily   Chronic recurrent major depressive disorder ([place]) 02/17/2016   Last Assessment & Plan:  Relevant Hx: Course: Daily Update: Today's Plan:increase the zoloft for her  Electronically signed by: Mayer Camel, NP 02/20/16 1107   Colon polyp    at age 32   Encounter for long-term current use of high risk medication 02/17/2016   Essential hypertension 02/17/2016   Last Assessment & Plan:  Relevant Hx: Course: Daily Update: Today's Plan:better on recheck for her continue with her current dose of med  Electronically signed by: Mayer Camel, NP 02/20/16 1103   Family history of colon cancer    Family history of ovarian cancer    Fatty liver    GERD  (gastroesophageal reflux disease)    Hallux valgus, acquired 05/04/2016   History of kidney stones    Hyperlipidemia    Hypertension    Hypokalemia 02/17/2016   Hypothyroidism (acquired) 02/17/2016   Last Assessment & Plan:  Relevant Hx: Course: Daily Update: Today's Plan:she has been stable in the past continue to follow  Electronically signed by: Mayer Camel, NP 02/20/16 1103   Kidney stone 05/07/2016   Localized edema 02/17/2016   Last Assessment & Plan:  Relevant Hx: Course: Daily Update: Today's Plan:is stable with her torsemide but today has increased due to her foot pain and presumed gout  Electronically signed by: Mayer Camel, NP 02/20/16 1107   Malaise and fatigue 02/17/2016   Last Assessment & Plan:  Relevant Hx: Course: Daily Update: Today's Plan:this is off and on for her and will follow this for her and discussed her mood as she is not sure that the zoloft increase will not help her more and will do this for her  Electronically signed by: Mayer Camel, NP 02/20/16 2055   Mixed hyperlipidemia 02/17/2016   Last Assessment & Plan:  Relevant Hx: Course: Daily Update: Today's Plan:not taking her med as she should be due to having had increased myalgias and will continue to follow this for her and see where her level is at this  time  Electronically signed by: Mayer Camel, NP 02/20/16 1104   Palpitation 10/13/2019   Primary insomnia 02/17/2016   Last Assessment & Plan:  Relevant Hx: Course: Daily Update: Today's Plan:this is stable for her  Electronically signed by: Mayer Camel, NP 02/20/16 1106   Thyroid disease    Vitamin D deficiency 02/17/2016    Past Surgical History:  Procedure Laterality Date   CERVICAL BIOPSY  W/ LOOP ELECTRODE EXCISION  2007   CIN II - Dr. Quincy Simmonds   CHOLECYSTECTOMY     COLPOSCOPY  2008   PARATHYROIDECTOMY     TONSILLECTOMY      Current Outpatient Medications  Medication Sig Dispense Refill    allopurinol (ZYLOPRIM) 300 MG tablet Take 300 mg by mouth daily.     atorvastatin (LIPITOR) 20 MG tablet Take 1 tablet by mouth at bedtime.     carvedilol (COREG) 6.25 MG tablet TAKE 1 TABLET BY MOUTH TWICE DAILY 180 tablet 3   clindamycin (CLEOCIN) 300 MG capsule Take 300 mg by mouth 2 (two) times daily.     colchicine 0.6 MG tablet Take by mouth.     esomeprazole (NEXIUM) 40 MG capsule   0   indomethacin (INDOCIN) 50 MG capsule TAKE 1 CAPSULE(50 MG) BY MOUTH TWICE DAILY WITH A MEAL 30 capsule 0   latanoprost (XALATAN) 0.005 % ophthalmic solution 1 drop at bedtime.     levothyroxine (SYNTHROID) 25 MCG tablet Take 25 mcg by mouth daily.     losartan (COZAAR) 100 MG tablet Take 100 mg by mouth daily.     losartan (COZAAR) 100 MG tablet Take 1 tablet by mouth daily.     pravastatin (PRAVACHOL) 40 MG tablet Take 1 tablet (40 mg total) by mouth every evening. 90 tablet 3   predniSONE (DELTASONE) 10 MG tablet Take by mouth.     sertraline (ZOLOFT) 50 MG tablet Take 1.5 tablets by mouth daily.     torsemide (DEMADEX) 20 MG tablet Take 1.5 tablets by mouth daily.     No current facility-administered medications for this visit.    Family History  Problem Relation Age of Onset   Colon cancer Mother 60   Leukemia Father 67   Hypertension Father    Heart attack Father    Ovarian cancer Maternal Aunt        dx in her late 85s   Ovarian cancer Maternal Grandmother 36   Kidney cancer Maternal Uncle        died in early 59s   Heart attack Maternal Grandfather    Heart attack Paternal Grandfather    Breast cancer Neg Hx     Review of Systems  Exam:   LMP 03/09/2015 (Within Months)     General appearance: alert, cooperative and appears stated age Head: normocephalic, without obvious abnormality, atraumatic Neck: no adenopathy, supple, symmetrical, trachea midline and thyroid normal to inspection and palpation Lungs: clear to auscultation bilaterally Breasts: normal appearance, no masses or  tenderness, No nipple retraction or dimpling, No nipple discharge or bleeding, No axillary adenopathy Heart: regular rate and rhythm Abdomen: soft, non-tender; no masses, no organomegaly Extremities: extremities normal, atraumatic, no cyanosis or edema Skin: skin color, texture, turgor normal. No rashes or lesions Lymph nodes: cervical, supraclavicular, and axillary nodes normal. Neurologic: grossly normal  Pelvic: External genitalia:  no lesions              No abnormal inguinal nodes palpated.  Urethra:  normal appearing urethra with no masses, tenderness or lesions              Bartholins and Skenes: normal                 Vagina: normal appearing vagina with normal color and discharge, no lesions              Cervix: no lesions              Pap taken: {yes no:314532} Bimanual Exam:  Uterus:  normal size, contour, position, consistency, mobility, non-tender              Adnexa: no mass, fullness, tenderness              Rectal exam: {yes no:314532}.  Confirms.              Anus:  normal sphincter tone, no lesions  Chaperone was present for exam:  ***  Assessment:   Well woman visit with gynecologic exam.   Plan: Mammogram screening discussed. Self breast awareness reviewed. Pap and HR HPV as above. Guidelines for Calcium, Vitamin D, regular exercise program including cardiovascular and weight bearing exercise.   Follow up annually and prn.   Additional counseling given.  {yes [id number]. _______ minutes face to face time of which over 50% was spent in counseling.    After visit summary provided.

## 2022-04-16 ENCOUNTER — Ambulatory Visit (INDEPENDENT_AMBULATORY_CARE_PROVIDER_SITE_OTHER): Payer: 59 | Admitting: Obstetrics and Gynecology

## 2022-04-16 ENCOUNTER — Other Ambulatory Visit (HOSPITAL_COMMUNITY)
Admission: RE | Admit: 2022-04-16 | Discharge: 2022-04-16 | Disposition: A | Discharge status: Home / Self Care | Payer: 59 | Source: Ambulatory Visit | Attending: Obstetrics and Gynecology | Admitting: Obstetrics and Gynecology

## 2022-04-16 ENCOUNTER — Encounter: Payer: Self-pay | Admitting: Obstetrics and Gynecology

## 2022-04-16 VITALS — BP 122/66 | HR 87 | Ht 62.5 in | Wt 190.0 lb

## 2022-04-16 DIAGNOSIS — Z124 Encounter for screening for malignant neoplasm of cervix: Secondary | ICD-10-CM

## 2022-04-16 DIAGNOSIS — Z87410 Personal history of cervical dysplasia: Secondary | ICD-10-CM | POA: Insufficient documentation

## 2022-04-16 DIAGNOSIS — Z01419 Encounter for gynecological examination (general) (routine) without abnormal findings: Secondary | ICD-10-CM | POA: Diagnosis not present

## 2022-04-16 NOTE — Patient Instructions (Signed)
Calcium Content in Foods Calcium is the most abundant mineral in the body. Most of the body's calcium supply is stored in bones and teeth. Calcium helps many parts of the body function normally, including: Blood and blood vessels. Nerves. Hormones. Muscles. Bones and teeth. When your calcium stores are low, you may be at risk for low bone mass, bone loss, and broken bones (fractures). When you get enough calcium, it helps to support strong bones and teeth throughout your life. Calcium is especially important for: Children during growth spurts. Girls during adolescence. Women who are pregnant or breastfeeding. Women after their menstrual cycle stops (postmenopause). Women whose menstrual cycle has stopped due to anorexia nervosa or regular intense exercise. People who cannot eat or digest dairy products. Vegans. Recommended daily amounts of calcium: Women (ages 73 to 44): 1,000 mg per day. Women (ages 44 and older): 1,200 mg per day. Men (ages 35 to 74): 1,000 mg per day. Men (ages 87 and older): 1,200 mg per day. Women (ages 29 to 79): 1,300 mg per day. Men (ages 4 to 11): 1,300 mg per day. General information Eat foods that are high in calcium. Try to get most of your calcium from food. Some people may benefit from taking calcium supplements. Check with your health care provider or diet and nutrition specialist (dietitian) before starting any calcium supplements. Calcium supplements may interact with certain medicines. Too much calcium may cause other health problems, such as constipation and kidney stones. For the body to absorb calcium, it needs vitamin D. Sources of vitamin D include: Skin exposure to direct sunlight. Foods, such as egg yolks, liver, mushrooms, saltwater fish, and fortified milk. Vitamin D supplements. Check with your health care provider or dietitian before starting any vitamin D supplements. What foods are high in calcium?  Foods that are high in calcium contain  more than 100 milligrams per serving. Fruits Fortified orange juice or other fruit juice, 300 mg per 8 oz serving. Vegetables Collard greens, 360 mg per 8 oz serving. Kale, 100 mg per 8 oz serving. Bok choy, 160 mg per 8 oz serving. Grains Fortified ready-to-eat cereals, 100 to 1,000 mg per 8 oz serving. Fortified frozen waffles, 200 mg in 2 waffles. Oatmeal, 140 mg in 1 cup. Meats and other proteins Sardines, canned with bones, 325 mg per 3 oz serving. Salmon, canned with bones, 180 mg per 3 oz serving. Canned shrimp, 125 mg per 3 oz serving. Baked beans, 160 mg per 4 oz serving. Tofu, firm, made with calcium sulfate, 253 mg per 4 oz serving. Dairy Yogurt, plain, low-fat, 310 mg per 6 oz serving. Nonfat milk, 300 mg per 8 oz serving. American cheese, 195 mg per 1 oz serving. Cheddar cheese, 205 mg per 1 oz serving. Cottage cheese 2%, 105 mg per 4 oz serving. Fortified soy, rice, or almond milk, 300 mg per 8 oz serving. Mozzarella, part skim, 210 mg per 1 oz serving. The items listed above may not be a complete list of foods high in calcium. Actual amounts of calcium may be different depending on processing. Contact a dietitian for more information. What foods are lower in calcium? Foods that are lower in calcium contain 50 mg or less per serving. Fruits Apple, about 6 mg. Banana, about 12 mg. Vegetables Lettuce, 19 mg per 2 oz serving. Tomato, about 11 mg. Grains Rice, 4 mg per 6 oz serving. Boiled potatoes, 14 mg per 8 oz serving. White bread, 6 mg per slice. Meats and other proteins  Egg, 27 mg per 2 oz serving. Red meat, 7 mg per 4 oz serving. Chicken, 17 mg per 4 oz serving. Fish, cod, or trout, 20 mg per 4 oz serving. Dairy Cream cheese, regular, 14 mg per 1 Tbsp serving. Brie cheese, 50 mg per 1 oz serving. Parmesan cheese, 70 mg per 1 Tbsp serving. The items listed above may not be a complete list of foods lower in calcium. Actual amounts of calcium may be  different depending on processing. Contact a dietitian for more information. Summary Calcium is an important mineral in the body because it affects many functions. Getting enough calcium helps support strong bones and teeth throughout your life. Try to get most of your calcium from food. Calcium supplements may interact with certain medicines. Check with your health care provider or dietitian before starting any calcium supplements. This information is not intended to replace advice given to you by your health care provider. Make sure you discuss any questions you have with your health care provider. Document Revised: 01/20/2020 Document Reviewed: 01/20/2020 Elsevier Patient Education  [place] AND DIET:  We recommended that you start or continue a regular exercise program for good health. Regular exercise means any activity that makes your heart beat faster and makes you sweat.  We recommend exercising at least 30 minutes per day at least 3 days a week, preferably 4 or 5.  We also recommend a diet low in fat and sugar.  Inactivity, poor dietary choices and obesity can cause diabetes, heart attack, stroke, and kidney damage, among others.    ALCOHOL AND SMOKING:  Women should limit their alcohol intake to no more than 7 drinks/beers/glasses of wine (combined, not each!) per week. Moderation of alcohol intake to this level decreases your risk of breast cancer and liver damage. And of course, no recreational drugs are part of a healthy lifestyle.  And absolutely no smoking or even second hand smoke. Most people know smoking can cause heart and lung diseases, but did you know it also contributes to weakening of your bones? Aging of your skin?  Yellowing of your teeth and nails?  CALCIUM AND VITAMIN D:  Adequate intake of calcium and Vitamin D are recommended.  The recommendations for exact amounts of these supplements seem to change often, but generally speaking 600 mg of calcium  (either carbonate or citrate) and 800 units of Vitamin D per day seems prudent. Certain women may benefit from higher intake of Vitamin D.  If you are among these women, your doctor will have told you during your visit.    PAP SMEARS:  Pap smears, to check for cervical cancer or precancers,  have traditionally been done yearly, although recent scientific advances have shown that most women can have pap smears less often.  However, every woman still should have a physical exam from her gynecologist every year. It will include a breast check, inspection of the vulva and vagina to check for abnormal growths or skin changes, a visual exam of the cervix, and then an exam to evaluate the size and shape of the uterus and ovaries.  And after 60 years of age, a rectal exam is indicated to check for rectal cancers. We will also provide age appropriate advice regarding health maintenance, like when you should have certain vaccines, screening for sexually transmitted diseases, bone density testing, colonoscopy, mammograms, etc.   MAMMOGRAMS:  All women over 51 years old should have a yearly mammogram. Many facilities now offer  a "3D" mammogram, which may cost around $50 extra out of pocket. If possible,  we recommend you accept the option to have the 3D mammogram performed.  It both reduces the number of women who will be called back for extra views which then turn out to be normal, and it is better than the routine mammogram at detecting truly abnormal areas.    COLONOSCOPY:  Colonoscopy to screen for colon cancer is recommended for all women at age 81.  We know, you hate the idea of the prep.  We agree, BUT, having colon cancer and not knowing it is worse!!  Colon cancer so often starts as a polyp that can be seen and removed at colonscopy, which can quite literally save your life!  And if your first colonoscopy is normal and you have no family history of colon cancer, most women don't have to have it again for 10  years.  Once every ten years, you can do something that may end up saving your life, right?  We will be happy to help you get it scheduled when you are ready.  Be sure to check your insurance coverage so you understand how much it will cost.  It may be covered as a preventative service at no cost, but you should check your particular policy.

## 2022-04-17 LAB — CYTOLOGY - PAP
Comment: NEGATIVE
Diagnosis: NEGATIVE
High risk HPV: NEGATIVE

## 2022-07-17 DIAGNOSIS — D2239 Melanocytic nevi of other parts of face: Secondary | ICD-10-CM | POA: Diagnosis not present

## 2022-07-17 DIAGNOSIS — D225 Melanocytic nevi of trunk: Secondary | ICD-10-CM | POA: Diagnosis not present

## 2022-07-17 DIAGNOSIS — L814 Other melanin hyperpigmentation: Secondary | ICD-10-CM | POA: Diagnosis not present

## 2022-07-17 DIAGNOSIS — L820 Inflamed seborrheic keratosis: Secondary | ICD-10-CM | POA: Diagnosis not present

## 2022-07-17 DIAGNOSIS — L821 Other seborrheic keratosis: Secondary | ICD-10-CM | POA: Diagnosis not present

## 2022-10-25 DIAGNOSIS — R600 Localized edema: Secondary | ICD-10-CM | POA: Diagnosis not present

## 2022-10-25 DIAGNOSIS — E782 Mixed hyperlipidemia: Secondary | ICD-10-CM | POA: Diagnosis not present

## 2022-10-25 DIAGNOSIS — E039 Hypothyroidism, unspecified: Secondary | ICD-10-CM | POA: Diagnosis not present

## 2022-10-25 DIAGNOSIS — R5381 Other malaise: Secondary | ICD-10-CM | POA: Diagnosis not present

## 2022-10-25 DIAGNOSIS — I129 Hypertensive chronic kidney disease with stage 1 through stage 4 chronic kidney disease, or unspecified chronic kidney disease: Secondary | ICD-10-CM | POA: Diagnosis not present

## 2022-10-25 DIAGNOSIS — M1A09X0 Idiopathic chronic gout, multiple sites, without tophus (tophi): Secondary | ICD-10-CM | POA: Diagnosis not present

## 2022-10-25 DIAGNOSIS — R5383 Other fatigue: Secondary | ICD-10-CM | POA: Diagnosis not present

## 2022-10-25 DIAGNOSIS — N1831 Chronic kidney disease, stage 3a: Secondary | ICD-10-CM | POA: Diagnosis not present

## 2022-10-25 DIAGNOSIS — E876 Hypokalemia: Secondary | ICD-10-CM | POA: Diagnosis not present

## 2022-10-25 DIAGNOSIS — Z79899 Other long term (current) drug therapy: Secondary | ICD-10-CM | POA: Diagnosis not present

## 2022-10-25 DIAGNOSIS — Z0000 Encounter for general adult medical examination without abnormal findings: Secondary | ICD-10-CM | POA: Diagnosis not present

## 2022-10-25 DIAGNOSIS — R69 Illness, unspecified: Secondary | ICD-10-CM | POA: Diagnosis not present

## 2022-10-25 DIAGNOSIS — K760 Fatty (change of) liver, not elsewhere classified: Secondary | ICD-10-CM | POA: Diagnosis not present

## 2022-10-25 DIAGNOSIS — Q231 Congenital insufficiency of aortic valve: Secondary | ICD-10-CM | POA: Diagnosis not present

## 2022-10-25 DIAGNOSIS — N200 Calculus of kidney: Secondary | ICD-10-CM | POA: Diagnosis not present

## 2022-11-09 ENCOUNTER — Telehealth: Payer: Self-pay | Admitting: Cardiology

## 2022-11-09 NOTE — Telephone Encounter (Signed)
*  STAT* If patient is at the pharmacy, call can be transferred to refill team.   1. Which medications need to be refilled? (please list name of each medication and dose if known)  Carvedilol  2. Which pharmacy/location (including street and city if local pharmacy) is medication to be sent to? [facility] [place], [place]  3. Do they need a 30 day or 90 day supply?  90 day supply

## 2022-11-12 NOTE — Telephone Encounter (Signed)
Not able to refill. Pt needs to see cardiologist.

## 2022-11-12 NOTE — Telephone Encounter (Signed)
Patient has an appointment 01/02/2023.

## 2022-11-14 NOTE — Telephone Encounter (Signed)
Patient will have to see cardiologist as scheduled before medication is refilled.

## 2022-11-14 NOTE — Telephone Encounter (Signed)
Called patient back, she states she needs a refill, she is scheduled for a visit on 03/27- which I was going to refill enough to get to this appointment, however Carvedilol is not on her list at this time. It was removed, and not added back.   Is this okay to add back?  Just wanted to check, thanks!

## 2022-11-14 NOTE — Telephone Encounter (Signed)
Pt is calling again to see why this medication is not being filled even though patient has appt scheduled and is on wait list. Requesting return call.

## 2022-11-16 ENCOUNTER — Telehealth: Payer: Self-pay

## 2022-11-16 ENCOUNTER — Other Ambulatory Visit: Payer: Self-pay | Admitting: Cardiology

## 2022-11-16 MED ORDER — CARVEDILOL 6.25 MG PO TABS: 6.2500 mg | ORAL_TABLET | Freq: Two times a day (BID) | ORAL | 1 refills | Status: DC

## 2022-11-16 NOTE — Telephone Encounter (Signed)
Rx refill sent to pharmacy. 

## 2022-11-16 NOTE — Telephone Encounter (Signed)
Follow Up:     Patient is calling back to check on Carvedilol refill. Patient says she have been taking this for about 2 years. She needs her refill please.     *STAT* If patient is at the pharmacy, call can be transferred to refill team.   1. Which medications need to be refilled? (please list name of each medication and dose if known) Carvedilol  2. Which pharmacy/location (including street and city if local pharmacy) is medication to be sent to? [place],  [place]  3. Do they need a 30 day or 90 day supply? Need this called in today- been out of it for over a week

## 2022-11-16 NOTE — Telephone Encounter (Signed)
Pt stated she has not taken carvedilol for a week (out of med). Upon reviewing her chart, Carvedilol 3.125 mg twice daily was changed to 6.73m twice daily. Order placed for pt.

## 2022-11-16 NOTE — Telephone Encounter (Signed)
error 

## 2022-11-21 ENCOUNTER — Ambulatory Visit: Payer: 59 | Attending: Cardiology | Admitting: Cardiology

## 2022-11-21 VITALS — BP 132/78 | HR 60 | Ht 63.0 in | Wt 188.8 lb

## 2022-11-21 DIAGNOSIS — Q231 Congenital insufficiency of aortic valve: Secondary | ICD-10-CM | POA: Diagnosis not present

## 2022-11-21 DIAGNOSIS — E782 Mixed hyperlipidemia: Secondary | ICD-10-CM

## 2022-11-21 DIAGNOSIS — I10 Essential (primary) hypertension: Secondary | ICD-10-CM | POA: Diagnosis not present

## 2022-11-21 DIAGNOSIS — I4729 Other ventricular tachycardia: Secondary | ICD-10-CM

## 2022-11-21 MED ORDER — ATORVASTATIN CALCIUM 20 MG PO TABS: 20.0000 mg | ORAL_TABLET | Freq: Every day | ORAL | 3 refills | Status: DC

## 2022-11-21 MED ORDER — TORSEMIDE 20 MG PO TABS: 30.0000 mg | ORAL_TABLET | Freq: Every day | ORAL | 3 refills | Status: AC

## 2022-11-21 MED ORDER — LOSARTAN POTASSIUM 50 MG PO TABS: ORAL_TABLET | ORAL | 3 refills | Status: AC

## 2022-11-21 MED ORDER — CARVEDILOL 6.25 MG PO TABS: 6.2500 mg | ORAL_TABLET | Freq: Two times a day (BID) | ORAL | 0 refills | Status: DC

## 2022-11-21 NOTE — Patient Instructions (Signed)
Medication Instructions:  Your physician recommends that you continue on your current medications as directed. Please refer to the Current Medication list given to you today.  *If you need a refill on your cardiac medications before your next appointment, please call your pharmacy*   Lab Work: None If you have labs (blood work) drawn today and your tests are completely normal, you will receive your results only by: [place] (if you have MyChart) OR A paper copy in the mail If you have any lab test that is abnormal or we need to change your treatment, we will call you to review the results.   Testing/Procedures: None   Follow-Up: At [facility], you and your health needs are our priority.  As part of our continuing mission to provide you with exceptional heart care, we have created designated Provider Care Teams.  These Care Teams include your primary Cardiologist (physician) and Advanced Practice Providers (APPs -  Physician Assistants and Nurse Practitioners) who all work together to provide you with the care you need, when you need it.  Your next appointment:   1 year(s)  Provider:   Berniece Salines, DO

## 2022-11-21 NOTE — Progress Notes (Signed)
Cardiology Office Note:    Date:  11/21/2022   ID:  Erin Andrade, DOB 1961-07-14, MRN [id number]  PCP:  Raina Mina., MD  Cardiologist:  Berniece Salines, DO  Electrophysiologist:  None   Referring MD: Raina Mina., MD   No chief complaint on file.   History of Present Illness:    Erin Andrade is a 62 y.o. female with a hx of hyperlipidemia, NSVT on low-dose beta-blocker carvedilol 6.25 mg twice daily he is here today for follow-up visit.  Last saw the patient December 2021 at that time she appeared to be doing well from a cardiovascular standpoint. Today she tells me that she believes that the Lipitor is making her muscle aches and pains worse.  He denies any chest pain, shortness of breath.   Past Medical History:  Diagnosis Date   Abnormal Pap smear of cervix 2008?   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   Anxiety    Aortic valve disorder 05/07/2016   Bicuspid aortic valve 05/07/2016   Last Assessment & Plan:  Reviewed her last [place] she had which was stable for her   Chronic idiopathic gout of multiple sites 05/15/2016   Last Assessment & Plan:  Increase this uloric to 80 mg daily for her and she can use the colchicine for flares hopefully rather than try to take daily   Chronic recurrent major depressive disorder ([place]) 02/17/2016   Last Assessment & Plan:  Relevant Hx: Course: Daily Update: Today's Plan:increase the zoloft for her  Electronically signed by: Mayer Camel, NP 02/20/16 1107   Colon polyp    at age 67   Encounter for long-term current use of high risk medication 02/17/2016   Essential hypertension 02/17/2016   Last Assessment & Plan:  Relevant Hx: Course: Daily Update: Today's Plan:better on recheck for her continue with her current dose of med  Electronically signed by: Mayer Camel, NP 02/20/16 1103   Family history of colon cancer    Family history of ovarian cancer    Fatty liver    GERD (gastroesophageal reflux disease)     Hallux valgus, acquired 05/04/2016   History of kidney stones    Hyperlipidemia    Hypertension    Hypokalemia 02/17/2016   Hypothyroidism (acquired) 02/17/2016   Last Assessment & Plan:  Relevant Hx: Course: Daily Update: Today's Plan:she has been stable in the past continue to follow  Electronically signed by: Mayer Camel, NP 02/20/16 1103   Kidney stone 05/07/2016   Localized edema 02/17/2016   Last Assessment & Plan:  Relevant Hx: Course: Daily Update: Today's Plan:is stable with her torsemide but today has increased due to her foot pain and presumed gout  Electronically signed by: Mayer Camel, NP 02/20/16 1107   Malaise and fatigue 02/17/2016   Last Assessment & Plan:  Relevant Hx: Course: Daily Update: Today's Plan:this is off and on for her and will follow this for her and discussed her mood as she is not sure that the zoloft increase will not help her more and will do this for her  Electronically signed by: Mayer Camel, NP 02/20/16 2055   Mixed hyperlipidemia 02/17/2016   Last Assessment & Plan:  Relevant Hx: Course: Daily Update: Today's Plan:not taking her med as she should be due to having had increased myalgias and will continue to follow this for her and see where her level is at this time  Electronically signed by: Mayer Camel, NP  02/20/16 1104   Palpitation 10/13/2019   Primary insomnia 02/17/2016   Last Assessment & Plan:  Relevant Hx: Course: Daily Update: Today's Plan:this is stable for her  Electronically signed by: Mayer Camel, NP 02/20/16 1106   Thyroid disease    Vitamin D deficiency 02/17/2016    Past Surgical History:  Procedure Laterality Date   CERVICAL BIOPSY  W/ LOOP ELECTRODE EXCISION  2007   CIN II - Dr. Quincy Simmonds   CHOLECYSTECTOMY     COLPOSCOPY  2008   PARATHYROIDECTOMY     TONSILLECTOMY      Current Medications: Current Meds  Medication Sig   allopurinol (ZYLOPRIM) 300 MG tablet Take 300 mg  by mouth daily.   levothyroxine (SYNTHROID) 25 MCG tablet Take 25 mcg by mouth daily.   sertraline (ZOLOFT) 50 MG tablet Take 1.5 tablets by mouth daily.   [DISCONTINUED] atorvastatin (LIPITOR) 20 MG tablet Take 1 tablet by mouth at bedtime.   [DISCONTINUED] carvedilol (COREG) 6.25 MG tablet Take 1 tablet (6.25 mg total) by mouth 2 (two) times daily with a meal.   [DISCONTINUED] losartan (COZAAR) 50 MG tablet TK 1 T PO  QD   [DISCONTINUED] torsemide (DEMADEX) 20 MG tablet Take 1.5 tablets by mouth daily.     Allergies:   Lipitor [atorvastatin] and Penicillins   Social History   Socioeconomic History   Marital status: Married    Spouse name: Not on file   Number of children: 0   Years of education: Not on file   Highest education level: Not on file  Occupational History   Not on file  Tobacco Use   Smoking status: Never   Smokeless tobacco: Never  Vaping Use   Vaping Use: Never used  Substance and Sexual Activity   Alcohol use: No    Alcohol/week: 0.0 standard drinks of alcohol   Drug use: No   Sexual activity: Not Currently    Partners: Male    Birth control/protection: Post-menopausal    Comment: first intercourse >17, less than 5 partners  Other Topics Concern   Not on file  Social History Narrative   Not on file   Social Determinants of Health   Financial Resource Strain: Not on file  Food Insecurity: Not on file  Transportation Needs: Not on file  Physical Activity: Not on file  Stress: Not on file  Social Connections: Not on file     Family History: The patient's family history includes Colon cancer (age of onset: 46) in her mother; Heart attack in her father, maternal grandfather, and paternal grandfather; Hypertension in her father; Kidney cancer in her maternal uncle; Leukemia (age of onset: 51) in her father; Ovarian cancer in her maternal aunt; Ovarian cancer (age of onset: 88) in her maternal grandmother. There is no history of Breast cancer.  ROS:    Review of Systems  Constitution: Negative for decreased appetite, fever and weight gain.  HENT: Negative for congestion, ear discharge, hoarse voice and sore throat.   Eyes: Negative for discharge, redness, vision loss in right eye and visual halos.  Cardiovascular: Negative for chest pain, dyspnea on exertion, leg swelling, orthopnea and palpitations.  Respiratory: Negative for cough, hemoptysis, shortness of breath and snoring.   Endocrine: Negative for heat intolerance and polyphagia.  Hematologic/Lymphatic: Negative for bleeding problem. Does not bruise/bleed easily.  Skin: Negative for flushing, nail changes, rash and suspicious lesions.  Musculoskeletal: Negative for arthritis, joint pain, muscle cramps, myalgias, neck pain and stiffness.  Gastrointestinal: Negative for  abdominal pain, bowel incontinence, diarrhea and excessive appetite.  Genitourinary: Negative for decreased libido, genital sores and incomplete emptying.  Neurological: Negative for brief paralysis, focal weakness, headaches and loss of balance.  Psychiatric/Behavioral: Negative for altered mental status, depression and suicidal ideas.  Allergic/Immunologic: Negative for HIV exposure and persistent infections.    EKGs/Labs/Other Studies Reviewed:    The following studies were reviewed today:   EKG:  The ekg ordered today demonstrates   Zio monitor September 2021 The patient wore the monitor for 5 days 23 hours starting June 06, 2020. Indication: Palpitations   The minimum heart rate was 54 bpm, maximum heart rate was 203 bpm, and average heart rate was 73 bpm. Predominant underlying rhythm was Sinus Rhythm.   1 run of Ventricular Tachycardia occurred lasting 18 beats with a maximum rate of 203 bpm (average 177 bpm).   Premature atrial complexes were rare less than 1%. Premature Ventricular complexes rare less than 1%.   No pauses, No AV block, no supraventricular tachycardia and no atrial fibrillation  present. 2 patient triggered events into diary events associated with sinus rhythm.    Conclusion: This study is remarkable for the following:  Nonsustained ventricular tachycardia.   Pharmacologic stress test September 2021 The left ventricular ejection fraction is normal (55-65%). Nuclear stress EF: 56%. The study is normal. This is a low risk study.  March 2021 IMPRESSIONS     1. Left ventricular ejection fraction, by estimation, is 60 to 65%. The  left ventricle has normal function. The left ventricle has no regional  wall motion abnormalities. There is mild left ventricular hypertrophy.  Left ventricular diastolic parameters  were normal.   2. Right ventricular systolic function is normal. The right ventricular  size is normal. There is normal pulmonary artery systolic pressure.   3. The mitral valve is normal in structure. No evidence of mitral valve  regurgitation. No evidence of mitral stenosis.   4. The aortic valve is normal in structure. Aortic valve regurgitation is  mild. No aortic stenosis is present.   5. The inferior vena cava is normal in size with greater than 50%  respiratory variability, suggesting right atrial pressure of 3 mmHg.   Recent Labs: No results found for requested labs within last 365 days.  Recent Lipid Panel No results found for: "CHOL", "TRIG", "HDL", "CHOLHDL", "VLDL", "LDLCALC", "LDLDIRECT"  Physical Exam:    VS:  BP 132/78   Pulse 60   Ht 5' 3"$  (1.6 m)   Wt 85.6 kg   LMP 03/09/2015 (Within Months)   BMI 33.44 kg/m     Wt Readings from Last 3 Encounters:  11/21/22 85.6 kg  04/16/22 86.2 kg  08/15/21 84.8 kg     GEN: Well nourished, well developed in no acute distress HEENT: Normal NECK: No JVD; No carotid bruits LYMPHATICS: No lymphadenopathy CARDIAC: S1S2 noted,RRR, no murmurs, rubs, gallops RESPIRATORY:  Clear to auscultation without rales, wheezing or rhonchi  ABDOMEN: Soft, non-tender, non-distended, +bowel sounds, no  guarding. EXTREMITIES: No edema, No cyanosis, no clubbing MUSCULOSKELETAL:  No deformity  SKIN: Warm and dry NEUROLOGIC:  Alert and oriented x 3, non-focal PSYCHIATRIC:  Normal affect, good insight  ASSESSMENT:    1. NSVT (nonsustained ventricular tachycardia) ([place])   2. Bicuspid aortic valve   3. Essential hypertension   4. Primary hypertension   5. Mixed hyperlipidemia     PLAN:     She is doing well from a cardiovascular standpoint. No medication changes at this time.  The patient is in agreement with the above plan. The patient left the office in stable condition.  The patient will follow up in 1 year or sooner if needed.   Medication Adjustments/Labs and Tests Ordered: Current medicines are reviewed at length with the patient today.  Concerns regarding medicines are outlined above.  No orders of the defined types were placed in this encounter.  Meds ordered this encounter  Medications   atorvastatin (LIPITOR) 20 MG tablet    Sig: Take 1 tablet (20 mg total) by mouth at bedtime.    Dispense:  90 tablet    Refill:  3   carvedilol (COREG) 6.25 MG tablet    Sig: Take 1 tablet (6.25 mg total) by mouth 2 (two) times daily with a meal.    Dispense:  180 tablet    Refill:  0    **Patient requests 90 days supply**   losartan (COZAAR) 50 MG tablet    Sig: TK 1 T PO  QD    Dispense:  90 tablet    Refill:  3   torsemide (DEMADEX) 20 MG tablet    Sig: Take 1.5 tablets (30 mg total) by mouth daily.    Dispense:  135 tablet    Refill:  3    Patient Instructions  Medication Instructions:  Your physician recommends that you continue on your current medications as directed. Please refer to the Current Medication list given to you today.  *If you need a refill on your cardiac medications before your next appointment, please call your pharmacy*   Lab Work: None If you have labs (blood work) drawn today and your tests are completely normal, you will receive your results only  by: [place] (if you have MyChart) OR A paper copy in the mail If you have any lab test that is abnormal or we need to change your treatment, we will call you to review the results.   Testing/Procedures: None   Follow-Up: At [facility], you and your health needs are our priority.  As part of our continuing mission to provide you with exceptional heart care, we have created designated Provider Care Teams.  These Care Teams include your primary Cardiologist (physician) and Advanced Practice Providers (APPs -  Physician Assistants and Nurse Practitioners) who all work together to provide you with the care you need, when you need it.  Your next appointment:   1 year(s)  Provider:   Berniece Salines, DO       Adopting a Healthy Lifestyle.  Know what a healthy weight is for you (roughly BMI <25) and aim to maintain this   Aim for 7+ servings of fruits and vegetables daily   65-80+ fluid ounces of water or unsweet tea for healthy kidneys   Limit to max 1 drink of alcohol per day; avoid smoking/tobacco   Limit animal fats in diet for cholesterol and heart health - choose grass fed whenever available   Avoid highly processed foods, and foods high in saturated/trans fats   Aim for low stress - take time to unwind and care for your mental health   Aim for 150 min of moderate intensity exercise weekly for heart health, and weights twice weekly for bone health   Aim for 7-9 hours of sleep daily   When it comes to diets, agreement about the perfect plan isnt easy to find, even among the experts. Experts at the [employer] developed an idea known as the [employer]  Plate. Just imagine a plate divided into logical, healthy portions.   The emphasis is on diet quality:   Load up on vegetables and fruits - one-half of your plate: Aim for color and variety, and remember that potatoes dont count.   Go for whole grains - one-quarter of your plate: Whole  wheat, barley, wheat berries, quinoa, oats, brown rice, and foods made with them. If you want pasta, go with whole wheat pasta.   Protein power - one-quarter of your plate: Fish, chicken, beans, and nuts are all healthy, versatile protein sources. Limit red meat.   The diet, however, does go beyond the plate, offering a few other suggestions.   Use healthy plant oils, such as olive, canola, soy, corn, sunflower and peanut. Check the labels, and avoid partially hydrogenated oil, which have unhealthy trans fats.   If youre thirsty, drink water. Coffee and tea are good in moderation, but skip sugary drinks and limit milk and dairy products to one or two daily servings.   The type of carbohydrate in the diet is more important than the amount. Some sources of carbohydrates, such as vegetables, fruits, whole grains, and beans-are healthier than others.   Finally, stay active  Signed, Berniece Salines, DO  11/21/2022 2:30 PM    [facility]

## 2022-11-22 NOTE — Addendum Note (Signed)
Addended by: Amalia Hailey on: 11/22/2022 03:26 PM   Modules accepted: Orders

## 2022-12-03 IMAGING — MG DIGITAL SCREENING BILAT W/ TOMO W/ CAD
6 of 12 series · 6 of 36 positions shown · non-contrast
Comparison: Previous exam(s).

CLINICAL DATA: Screening.

EXAM:
DIGITAL SCREENING BILATERAL MAMMOGRAM WITH TOMO AND CAD

[L CC synth-2D (1 of 2)]
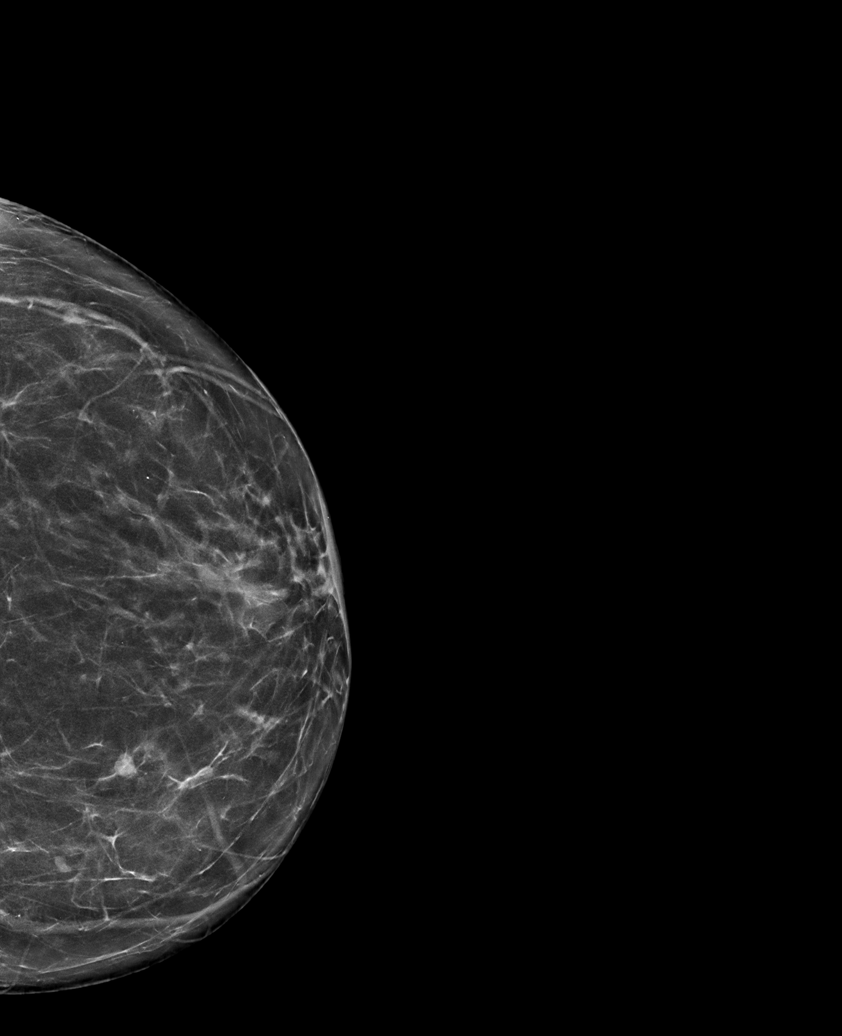

[L MLO synth-2D]
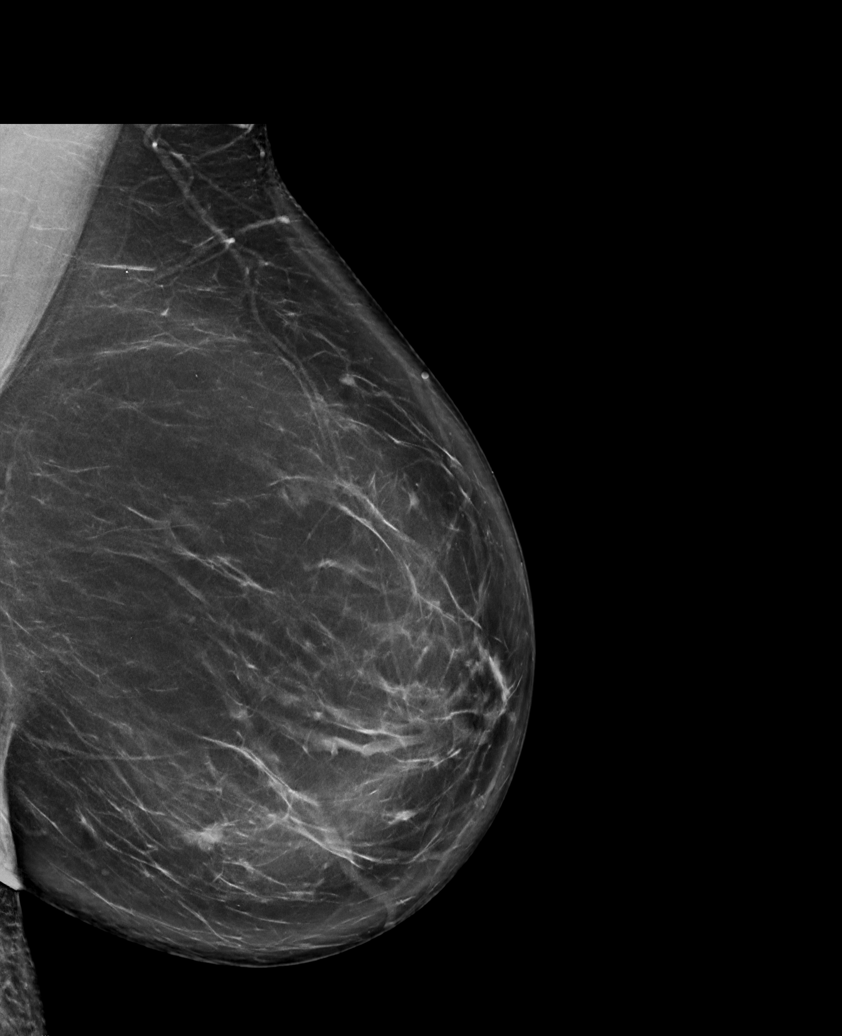

[R MLO synth-2D]
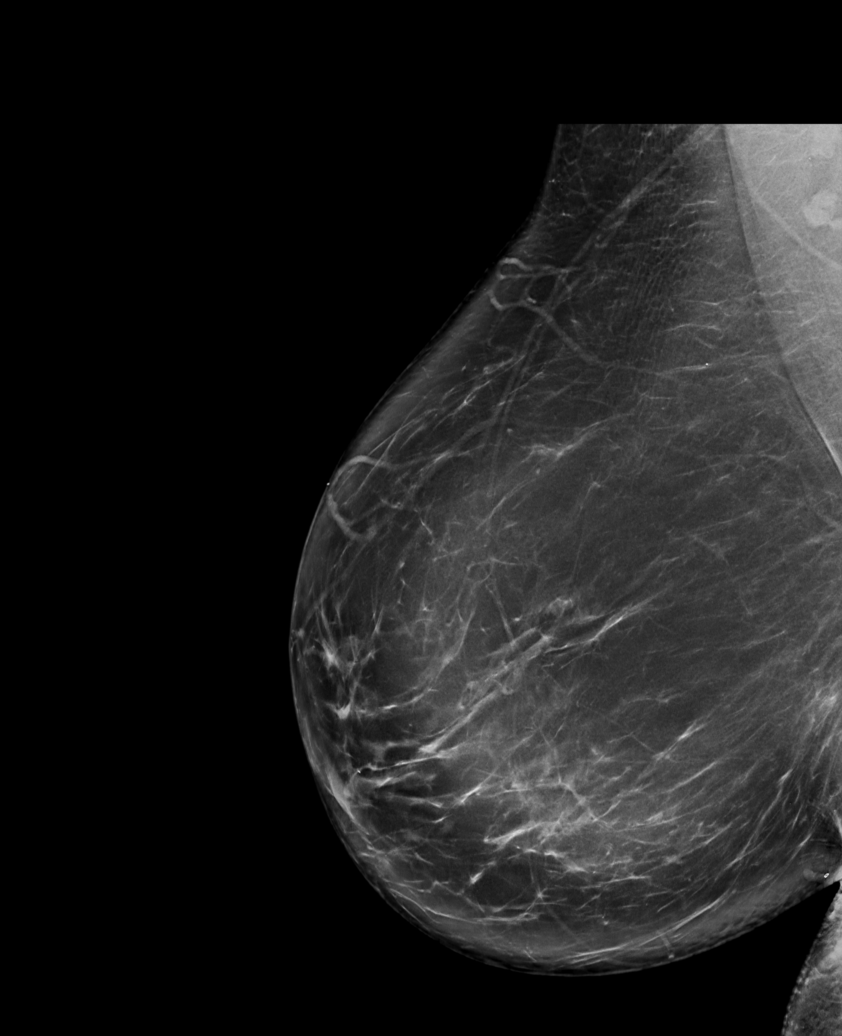

[R CC synth-2D (1 of 2)]
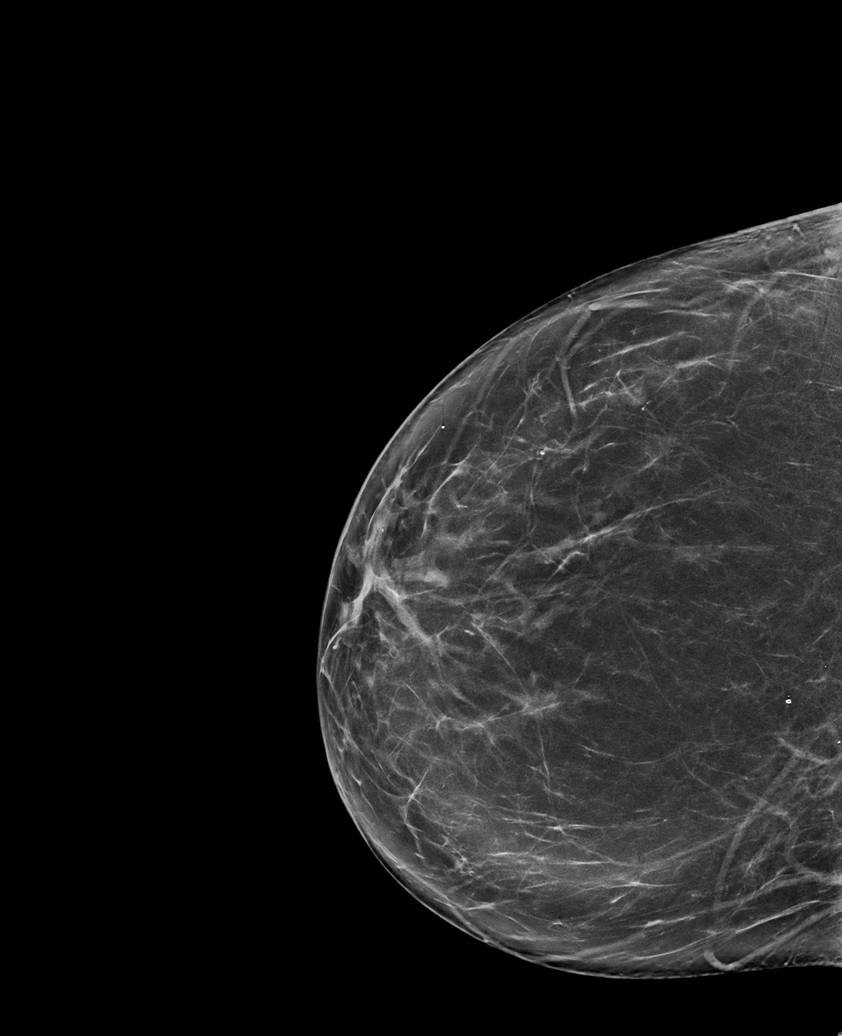

[L CC synth-2D (2 of 2)]
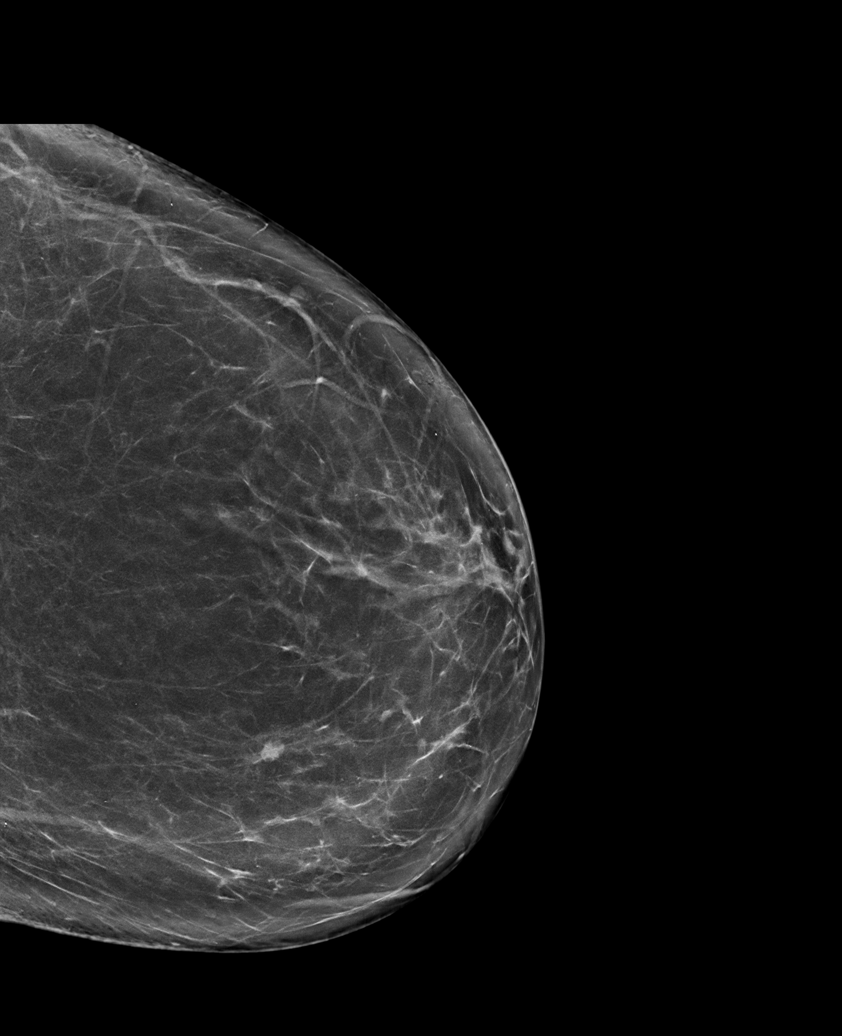

[R CC synth-2D (2 of 2)]
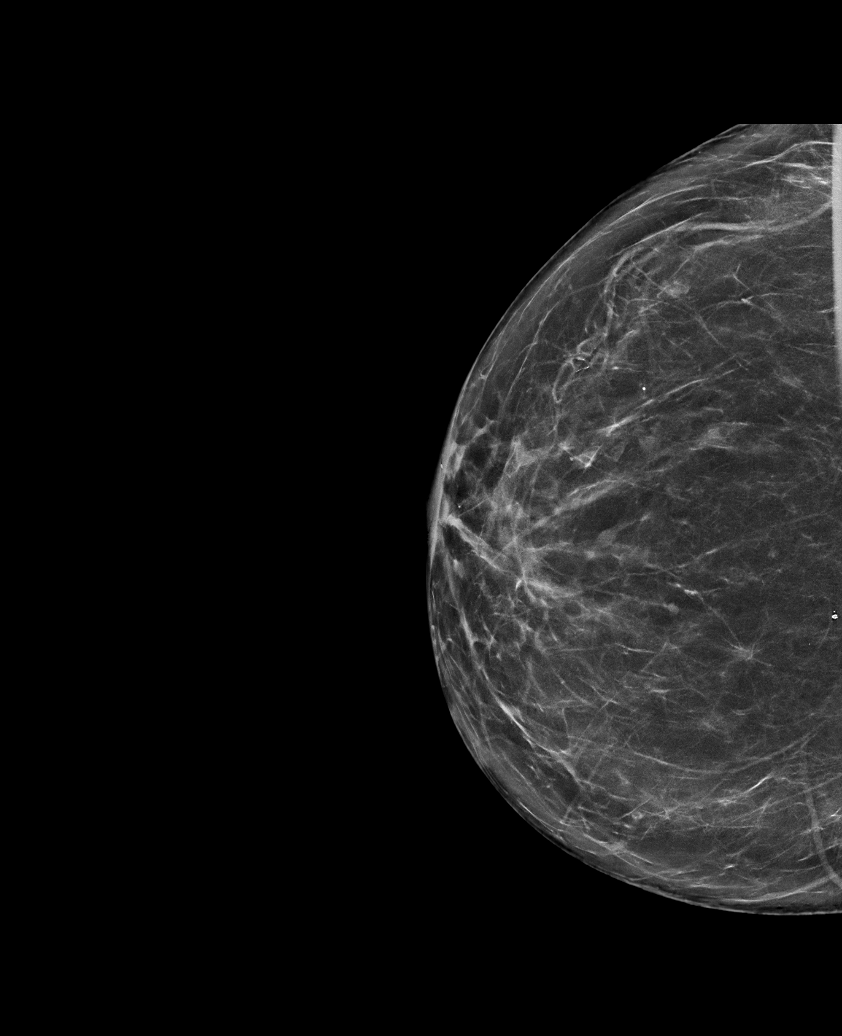

[6 of 36 positions shown; findings below may reference images not displayed]

ACR Breast Density Category b: There are scattered areas of
fibroglandular density.
FINDINGS: There are no findings suspicious for malignancy. Images were
processed with CAD.
IMPRESSION: No mammographic evidence of malignancy. A result letter of this
screening mammogram will be mailed directly to the patient.

RECOMMENDATION:
Screening mammogram in one year. (Code:[id number])

BI-RADS CATEGORY  1: Negative.

## 2023-01-02 ENCOUNTER — Ambulatory Visit: Payer: 59 | Admitting: Cardiology

## 2023-03-19 ENCOUNTER — Other Ambulatory Visit: Payer: Self-pay | Admitting: Cardiology

## 2023-03-19 NOTE — Telephone Encounter (Signed)
Rx sent to pharmacy   

## 2023-05-08 ENCOUNTER — Encounter: Payer: Self-pay | Admitting: Obstetrics and Gynecology

## 2023-05-09 NOTE — Telephone Encounter (Signed)
Last AEX 04/16/22 -BS Next AEX 07/04/23 -BS  Order to Dr. Edward Jolly to sign and fax.

## 2023-05-10 NOTE — Telephone Encounter (Signed)
Mammogram order was faxed successfully to [facility] on 05/09/2023.

## 2023-06-20 NOTE — Progress Notes (Signed)
62 y.o. G40P0000 Married Caucasian female here for annual exam.    No vaginal bleeding or discharge.  Some discomfort with sex.  Not using lubricants.  Just treated for a UTI with Cipro and Diflucan.   Working Psychologist, sport and exercise at pediatric office.   PCP:   Dr. Shary Decamp  Patient's last menstrual period was 03/09/2015 (within months).           Sexually active: No.  The current method of family planning is post menopausal status.    Exercising: No.     Smoker:  no  Health Maintenance: Pap:  04/16/22 neg: HR HPV neg, 03/10/21 neg: HR HPV neg History of abnormal Pap:  yes, 2007 Hx of LEEP--paps normal since.  MMG:  went [facility]/04/23 at [facility], 12/26/21 Breast Density Cat B, BI-RADS CAT 1 neg Colonoscopy:  2021 normal - every 5 years.  BMD:   n/a  Result  n/a TDaP:  06/15/16 Gardasil:   no HIV: donated blood in past Hep C: neg in past Screening Labs:  PCP   reports that she has never smoked. She has never used smokeless tobacco. She reports that she does not drink alcohol and does not use drugs.  Past Medical History:  Diagnosis Date   Abnormal Pap smear of cervix 2008?   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   Anxiety    Aortic valve disorder 05/07/2016   Bicuspid aortic valve 05/07/2016   Last Assessment & Plan:  Reviewed her last [place] she had which was stable for her   Chronic idiopathic gout of multiple sites 05/15/2016   Last Assessment & Plan:  Increase this uloric to 80 mg daily for her and she can use the colchicine for flares hopefully rather than try to take daily   Chronic recurrent major depressive disorder ([facility]) 02/17/2016   Last Assessment & Plan:  Relevant Hx: Course: Daily Update: Today's Plan:increase the zoloft for her  Electronically signed by: Krystal Clark, NP 02/20/16 1107   Colon polyp    at age 39   Encounter for long-term current use of high risk medication 02/17/2016   Essential hypertension 02/17/2016   Last Assessment & Plan:  Relevant Hx:  Course: Daily Update: Today's Plan:better on recheck for her continue with her current dose of med  Electronically signed by: Krystal Clark, NP 02/20/16 1103   Family history of colon cancer    Family history of ovarian cancer    Fatty liver    GERD (gastroesophageal reflux disease)    Hallux valgus, acquired 05/04/2016   History of kidney stones    Hyperlipidemia    Hypertension    Hypokalemia 02/17/2016   Hypothyroidism (acquired) 02/17/2016   Last Assessment & Plan:  Relevant Hx: Course: Daily Update: Today's Plan:she has been stable in the past continue to follow  Electronically signed by: Krystal Clark, NP 02/20/16 1103   Kidney stone 05/07/2016   Localized edema 02/17/2016   Last Assessment & Plan:  Relevant Hx: Course: Daily Update: Today's Plan:is stable with her torsemide but today has increased due to her foot pain and presumed gout  Electronically signed by: Krystal Clark, NP 02/20/16 1107   Malaise and fatigue 02/17/2016   Last Assessment & Plan:  Relevant Hx: Course: Daily Update: Today's Plan:this is off and on for her and will follow this for her and discussed her mood as she is not sure that the zoloft increase will not help her more and will do this for  her  Electronically signed by: Krystal Clark, NP 02/20/16 2055   Mixed hyperlipidemia 02/17/2016   Last Assessment & Plan:  Relevant Hx: Course: Daily Update: Today's Plan:not taking her med as she should be due to having had increased myalgias and will continue to follow this for her and see where her level is at this time  Electronically signed by: Krystal Clark, NP 02/20/16 1104   Palpitation 10/13/2019   Primary insomnia 02/17/2016   Last Assessment & Plan:  Relevant Hx: Course: Daily Update: Today's Plan:this is stable for her  Electronically signed by: Krystal Clark, NP 02/20/16 1106   Thyroid disease    Vitamin D deficiency 02/17/2016    Past Surgical  History:  Procedure Laterality Date   CERVICAL BIOPSY  W/ LOOP ELECTRODE EXCISION  2007   CIN II - Dr. Edward Jolly   CHOLECYSTECTOMY     COLPOSCOPY  2008   PARATHYROIDECTOMY     TONSILLECTOMY      Current Outpatient Medications  Medication Sig Dispense Refill   allopurinol (ZYLOPRIM) 300 MG tablet Take 300 mg by mouth daily.     atorvastatin (LIPITOR) 20 MG tablet Take 1 tablet (20 mg total) by mouth at bedtime. 90 tablet 3   carvedilol (COREG) 6.25 MG tablet TAKE 1 TABLET(6.25 MG) BY MOUTH TWICE DAILY WITH A MEAL 180 tablet 2   colchicine 0.6 MG tablet Take by mouth.     esomeprazole (NEXIUM) 40 MG capsule   0   latanoprost (XALATAN) 0.005 % ophthalmic solution 1 drop at bedtime.     levothyroxine (SYNTHROID) 25 MCG tablet Take 25 mcg by mouth daily.     losartan (COZAAR) 50 MG tablet TK 1 T PO  QD 90 tablet 3   sertraline (ZOLOFT) 50 MG tablet Take 1.5 tablets by mouth daily.     torsemide (DEMADEX) 20 MG tablet Take 1.5 tablets (30 mg total) by mouth daily. 135 tablet 3   No current facility-administered medications for this visit.    Family History  Problem Relation Age of Onset   Colon cancer Mother 70   Leukemia Father 31   Hypertension Father    Heart attack Father    Ovarian cancer Maternal Aunt        dx in her late 72s   Ovarian cancer Maternal Grandmother 10   Kidney cancer Maternal Uncle        died in early 63s   Heart attack Maternal Grandfather    Heart attack Paternal Grandfather    Breast cancer Neg Hx     Review of Systems  All other systems reviewed and are negative.   Exam:   BP 136/80 (BP Location: Left Arm, Patient Position: Sitting, Cuff Size: Normal)   Pulse 65   Ht 5\' 4"  (1.626 m)   Wt 183 lb (83 kg)   LMP 03/09/2015 (Within Months)   SpO2 96%   BMI 31.41 kg/m     General appearance: alert, cooperative and appears stated age Head: normocephalic, without obvious abnormality, atraumatic Neck: no adenopathy, supple, symmetrical, trachea midline  and thyroid normal to inspection and palpation Lungs: clear to auscultation bilaterally Breasts: normal appearance, no masses or tenderness, No nipple retraction or dimpling, No nipple discharge or bleeding, No axillary adenopathy Heart: regular rate and rhythm Abdomen: soft, non-tender; no masses, no organomegaly Extremities: extremities normal, atraumatic, no cyanosis or edema Skin: skin color, texture, turgor normal. No rashes or lesions Lymph nodes: cervical, supraclavicular, and axillary nodes normal. Neurologic:  grossly normal  Pelvic: External genitalia:  no lesions              No abnormal inguinal nodes palpated.              Urethra:  normal appearing urethra with no masses, tenderness or lesions              Bartholins and Skenes: normal                 Vagina: normal appearing vagina with normal color and discharge, no lesions              Cervix: no lesions              Pap taken: no Bimanual Exam:  Uterus:  normal size, contour, position, consistency, mobility, non-tender              Adnexa: no mass, fullness, tenderness              Rectal exam: yes.  Confirms.              Anus:  normal sphincter tone, no lesions  Chaperone was present for exam:  Warren Lacy, CMA  Assessment:   Well woman visit with gynecologic exam. FH ovarian cancer in second degree relatives and colon cancer in mother.  Patient had negative genetic testing.  Normal pelvic US 2022.  Hx of LEEP 2007.  CIN II. Status post partial parathyroidectomy.  Hx renal stones.   Plan: Mammogram screening discussed. Self breast awareness reviewed. Pap and HR HPV in 2026.  Guidelines for Calcium, Vitamin D, regular exercise program including cardiovascular and weight bearing exercise. Will schedule pelvic US if patient develops symptoms but not on a routine basis due to her negative genetic testing.  Follow up annually and prn.

## 2023-06-26 NOTE — Telephone Encounter (Signed)
Pt LVM in triage line on 06/24/2023 @ 115pm stating that [facility] is requesting previous mammo records prior to scheduling her for her screening.   Spoke w/ Thayer Ohm @ [facility] to confirm whether or not just reports were needed or actual films from previous imaging center and was advised that they would need both.   Will send pt mychart msg with directions to contact [facility] for medical records requests.

## 2023-07-04 ENCOUNTER — Encounter: Payer: Self-pay | Admitting: Obstetrics and Gynecology

## 2023-07-04 ENCOUNTER — Ambulatory Visit: Payer: Commercial Managed Care - PPO | Admitting: Obstetrics and Gynecology

## 2023-07-04 VITALS — BP 136/80 | HR 65 | Ht 64.0 in | Wt 183.0 lb

## 2023-07-04 DIAGNOSIS — Z01419 Encounter for gynecological examination (general) (routine) without abnormal findings: Secondary | ICD-10-CM | POA: Diagnosis not present

## 2023-07-04 NOTE — Patient Instructions (Signed)

## 2023-07-11 NOTE — Telephone Encounter (Signed)
LVMTCB or advised her to log on and read mychart msg at her earliest convenience with advise to obtain previous records/films.

## 2023-08-01 NOTE — Telephone Encounter (Signed)
LVMTCB or advised her to log on and read mychart msg at her earliest convenience with advise to obtain previous records/films.

## 2023-08-08 NOTE — Telephone Encounter (Signed)
Letter typed and routed to [facility] for review.

## 2023-08-12 NOTE — Telephone Encounter (Signed)
Letter signed and mailed to pt.

## 2023-12-29 ENCOUNTER — Other Ambulatory Visit: Payer: Self-pay | Admitting: Cardiology

## 2023-12-31 ENCOUNTER — Other Ambulatory Visit: Payer: Self-pay | Admitting: Cardiology

## 2024-01-09 ENCOUNTER — Other Ambulatory Visit: Payer: Self-pay | Admitting: Cardiology

## 2024-01-29 ENCOUNTER — Other Ambulatory Visit: Payer: Self-pay | Admitting: Cardiology

## 2024-02-16 ENCOUNTER — Other Ambulatory Visit: Payer: Self-pay | Admitting: Cardiology

## 2024-03-03 ENCOUNTER — Other Ambulatory Visit: Payer: Self-pay | Admitting: Cardiology

## 2024-06-10 ENCOUNTER — Encounter: Payer: Self-pay | Admitting: *Deleted

## 2024-06-12 ENCOUNTER — Ambulatory Visit: Payer: Self-pay | Admitting: Cardiology

## 2024-07-06 ENCOUNTER — Ambulatory Visit: Payer: Commercial Managed Care - PPO | Admitting: Obstetrics and Gynecology

## 2025-02-09 ENCOUNTER — Ambulatory Visit: Admitting: Obstetrics and Gynecology
